# Patient Record
Sex: Male | Born: 1945 | Race: White | Hispanic: No | Marital: Married | Smoking: Former smoker
Health system: Southern US, Community
[De-identification: ages and names within clinical notes are randomized; demographics above are authoritative.]

## PROBLEM LIST (undated history)

## (undated) DIAGNOSIS — I739 Peripheral vascular disease, unspecified: Secondary | ICD-10-CM

## (undated) DIAGNOSIS — I6529 Occlusion and stenosis of unspecified carotid artery: Secondary | ICD-10-CM

## (undated) DIAGNOSIS — I350 Nonrheumatic aortic (valve) stenosis: Secondary | ICD-10-CM

## (undated) DIAGNOSIS — I639 Cerebral infarction, unspecified: Secondary | ICD-10-CM

## (undated) DIAGNOSIS — M1990 Unspecified osteoarthritis, unspecified site: Secondary | ICD-10-CM

## (undated) DIAGNOSIS — R0600 Dyspnea, unspecified: Secondary | ICD-10-CM

## (undated) DIAGNOSIS — K635 Polyp of colon: Secondary | ICD-10-CM

## (undated) DIAGNOSIS — K219 Gastro-esophageal reflux disease without esophagitis: Secondary | ICD-10-CM

## (undated) DIAGNOSIS — C801 Malignant (primary) neoplasm, unspecified: Secondary | ICD-10-CM

## (undated) DIAGNOSIS — C609 Malignant neoplasm of penis, unspecified: Secondary | ICD-10-CM

## (undated) DIAGNOSIS — G459 Transient cerebral ischemic attack, unspecified: Secondary | ICD-10-CM

## (undated) DIAGNOSIS — Z9889 Other specified postprocedural states: Secondary | ICD-10-CM

## (undated) HISTORY — PX: TRANSURETHRAL RESECTION OF PROSTATE: SHX73

## (undated) HISTORY — PX: CHOLECYSTECTOMY: SHX55

## (undated) HISTORY — PX: VARICOSE VEIN SURGERY: SHX832

## (undated) HISTORY — PX: PENIS AMPUTATION: SUR28

## (undated) HISTORY — PX: AORTIC VALVE REPLACEMENT: SHX41

## (undated) HISTORY — PX: VEIN LIGATION AND STRIPPING: SHX2653

## (undated) HISTORY — PX: COLONOSCOPY: SHX174

## (undated) HISTORY — PX: APPENDECTOMY: SHX54

## (undated) HISTORY — PX: APPLICATION OF WOUND VAC: SHX5189

## (undated) HISTORY — PX: ESOPHAGOGASTRODUODENOSCOPY: SHX1529

## (undated) HISTORY — PX: OTHER SURGICAL HISTORY: SHX169

---

## 2006-03-10 ENCOUNTER — Ambulatory Visit: Payer: Self-pay | Admitting: Internal Medicine

## 2006-04-01 ENCOUNTER — Ambulatory Visit: Payer: Self-pay | Admitting: Internal Medicine

## 2006-04-20 ENCOUNTER — Ambulatory Visit (HOSPITAL_BASED_OUTPATIENT_CLINIC_OR_DEPARTMENT_OTHER): Admission: RE | Admit: 2006-04-20 | Discharge: 2006-04-20 | Payer: Self-pay | Admitting: Urology

## 2006-05-18 DIAGNOSIS — Z9889 Other specified postprocedural states: Secondary | ICD-10-CM

## 2006-05-18 HISTORY — DX: Other specified postprocedural states: Z98.890

## 2010-05-18 ENCOUNTER — Inpatient Hospital Stay: Payer: Self-pay | Admitting: Internal Medicine

## 2011-04-14 ENCOUNTER — Encounter: Payer: Self-pay | Admitting: Internal Medicine

## 2011-08-23 ENCOUNTER — Ambulatory Visit: Payer: Self-pay | Admitting: Gastroenterology

## 2012-05-10 ENCOUNTER — Encounter: Payer: Self-pay | Admitting: Internal Medicine

## 2016-03-17 ENCOUNTER — Other Ambulatory Visit: Payer: Self-pay | Admitting: Gastroenterology

## 2016-03-17 DIAGNOSIS — R935 Abnormal findings on diagnostic imaging of other abdominal regions, including retroperitoneum: Secondary | ICD-10-CM

## 2016-03-30 ENCOUNTER — Ambulatory Visit: Payer: Self-pay

## 2016-04-02 ENCOUNTER — Ambulatory Visit: Admission: RE | Admit: 2016-04-02 | Payer: Medicare HMO | Source: Ambulatory Visit

## 2016-04-07 ENCOUNTER — Ambulatory Visit: Payer: Medicare HMO

## 2016-04-21 ENCOUNTER — Ambulatory Visit
Admission: RE | Admit: 2016-04-21 | Discharge: 2016-04-21 | Disposition: A | Discharge status: Home / Self Care | Payer: Medicare HMO | Source: Ambulatory Visit | Attending: Gastroenterology | Admitting: Gastroenterology

## 2016-04-21 DIAGNOSIS — R935 Abnormal findings on diagnostic imaging of other abdominal regions, including retroperitoneum: Secondary | ICD-10-CM | POA: Diagnosis present

## 2016-04-21 DIAGNOSIS — Z8551 Personal history of malignant neoplasm of bladder: Secondary | ICD-10-CM | POA: Insufficient documentation

## 2016-04-21 DIAGNOSIS — D7389 Other diseases of spleen: Secondary | ICD-10-CM | POA: Insufficient documentation

## 2016-04-21 DIAGNOSIS — Z8546 Personal history of malignant neoplasm of prostate: Secondary | ICD-10-CM | POA: Insufficient documentation

## 2016-04-21 MED ORDER — GADOBENATE DIMEGLUMINE 529 MG/ML IV SOLN
20.0000 mL | Freq: Once | INTRAVENOUS | Status: AC | PRN
  Administered 2016-04-21: 16 mL via INTRAVENOUS

## 2016-04-26 ENCOUNTER — Ambulatory Visit
Admission: RE | Admit: 2016-04-26 | Discharge: 2016-04-26 | Disposition: A | Discharge status: Home / Self Care | Payer: Medicare HMO | Source: Ambulatory Visit | Attending: Gastroenterology | Admitting: Gastroenterology

## 2016-04-26 DIAGNOSIS — Z906 Acquired absence of other parts of urinary tract: Secondary | ICD-10-CM | POA: Diagnosis not present

## 2016-04-26 DIAGNOSIS — R933 Abnormal findings on diagnostic imaging of other parts of digestive tract: Secondary | ICD-10-CM | POA: Diagnosis not present

## 2016-04-26 DIAGNOSIS — Z9079 Acquired absence of other genital organ(s): Secondary | ICD-10-CM | POA: Diagnosis not present

## 2016-04-26 DIAGNOSIS — R935 Abnormal findings on diagnostic imaging of other abdominal regions, including retroperitoneum: Secondary | ICD-10-CM | POA: Diagnosis not present

## 2016-04-26 DIAGNOSIS — D734 Cyst of spleen: Secondary | ICD-10-CM | POA: Insufficient documentation

## 2016-04-26 HISTORY — DX: Malignant (primary) neoplasm, unspecified: C80.1

## 2016-04-26 MED ORDER — IOPAMIDOL (ISOVUE-300) INJECTION 61%
100.0000 mL | Freq: Once | INTRAVENOUS | Status: AC | PRN
  Administered 2016-04-26: 100 mL via INTRAVENOUS

## 2016-06-11 ENCOUNTER — Ambulatory Visit: Admit: 2016-06-11 | Payer: Medicare HMO | Admitting: Gastroenterology

## 2016-06-11 SURGERY — COLONOSCOPY WITH PROPOFOL
Anesthesia: General

## 2016-08-11 DIAGNOSIS — I350 Nonrheumatic aortic (valve) stenosis: Secondary | ICD-10-CM

## 2016-08-17 ENCOUNTER — Encounter
Admission: RE | Disposition: A | Discharge status: Home / Self Care | Payer: Self-pay | Source: Ambulatory Visit | Attending: Internal Medicine

## 2016-08-17 ENCOUNTER — Ambulatory Visit
Admission: RE | Admit: 2016-08-17 | Discharge: 2016-08-17 | Disposition: A | Discharge status: Home / Self Care | Payer: Medicare HMO | Source: Ambulatory Visit | Attending: Internal Medicine | Admitting: Internal Medicine

## 2016-08-17 DIAGNOSIS — Z8249 Family history of ischemic heart disease and other diseases of the circulatory system: Secondary | ICD-10-CM | POA: Diagnosis not present

## 2016-08-17 DIAGNOSIS — I350 Nonrheumatic aortic (valve) stenosis: Secondary | ICD-10-CM | POA: Diagnosis not present

## 2016-08-17 DIAGNOSIS — I110 Hypertensive heart disease with heart failure: Secondary | ICD-10-CM | POA: Insufficient documentation

## 2016-08-17 DIAGNOSIS — I509 Heart failure, unspecified: Secondary | ICD-10-CM | POA: Insufficient documentation

## 2016-08-17 HISTORY — DX: Transient cerebral ischemic attack, unspecified: G45.9

## 2016-08-17 HISTORY — PX: CARDIAC CATHETERIZATION: SHX172

## 2016-08-17 SURGERY — RIGHT/LEFT HEART CATH AND CORONARY ANGIOGRAPHY
Anesthesia: Moderate Sedation | Laterality: Bilateral

## 2016-08-17 MED ORDER — HEPARIN (PORCINE) IN NACL 2-0.9 UNIT/ML-% IJ SOLN
INTRAMUSCULAR | Status: AC
  Filled 2016-08-17: qty 1000

## 2016-08-17 MED ORDER — ACETAMINOPHEN 325 MG PO TABS: 650.0000 mg | ORAL_TABLET | ORAL | Status: DC | PRN

## 2016-08-17 MED ORDER — FENTANYL CITRATE (PF) 100 MCG/2ML IJ SOLN
INTRAMUSCULAR | Status: DC | PRN
  Administered 2016-08-17: 25 ug via INTRAVENOUS

## 2016-08-17 MED ORDER — FENTANYL CITRATE (PF) 100 MCG/2ML IJ SOLN
INTRAMUSCULAR | Status: AC
  Filled 2016-08-17: qty 2

## 2016-08-17 MED ORDER — ONDANSETRON HCL 4 MG/2ML IJ SOLN: 4.0000 mg | Freq: Four times a day (QID) | INTRAMUSCULAR | Status: DC | PRN

## 2016-08-17 MED ORDER — MIDAZOLAM HCL 2 MG/2ML IJ SOLN
INTRAMUSCULAR | Status: DC | PRN
  Administered 2016-08-17: 1 mg via INTRAVENOUS

## 2016-08-17 MED ORDER — SODIUM CHLORIDE 0.9% FLUSH
3.0000 mL | Freq: Two times a day (BID) | INTRAVENOUS | Status: DC
Start: 2016-08-17 — End: 2016-08-17

## 2016-08-17 MED ORDER — SODIUM CHLORIDE 0.9 % WEIGHT BASED INFUSION: 79.8000 mL/h | INTRAVENOUS | Status: DC

## 2016-08-17 MED ORDER — IOPAMIDOL (ISOVUE-300) INJECTION 61%
INTRAVENOUS | Status: DC | PRN
  Administered 2016-08-17: 135 mL via INTRA_ARTERIAL

## 2016-08-17 MED ORDER — MIDAZOLAM HCL 2 MG/2ML IJ SOLN
INTRAMUSCULAR | Status: AC
  Filled 2016-08-17: qty 2

## 2016-08-17 MED ORDER — SODIUM CHLORIDE 0.9 % IV SOLN: 250.0000 mL | INTRAVENOUS | Status: DC | PRN

## 2016-08-17 MED ORDER — ASPIRIN 81 MG PO CHEW: 81.0000 mg | CHEWABLE_TABLET | ORAL | Status: DC

## 2016-08-17 MED ORDER — SODIUM CHLORIDE 0.9 % WEIGHT BASED INFUSION
239.4000 mL/h | INTRAVENOUS | Status: DC
  Administered 2016-08-17: 3 mL/kg/h via INTRAVENOUS

## 2016-08-17 MED ORDER — SODIUM CHLORIDE 0.9% FLUSH: 3.0000 mL | INTRAVENOUS | Status: DC | PRN

## 2016-08-17 MED ORDER — SODIUM CHLORIDE 0.9 % WEIGHT BASED INFUSION: 1.0000 mL/kg/h | INTRAVENOUS | Status: DC

## 2016-08-17 MED ORDER — SODIUM CHLORIDE 0.9% FLUSH: 3.0000 mL | Freq: Two times a day (BID) | INTRAVENOUS | Status: DC

## 2016-08-17 SURGICAL SUPPLY — 18 items
CATH 5FR JL4 DIAGNOSTIC (CATHETERS) ×3 IMPLANT
CATH INFINITI 5 FR MPA2 (CATHETERS) ×3 IMPLANT
CATH INFINITI 5FR AL1 (CATHETERS) ×3 IMPLANT
CATH INFINITI 5FR ANG PIGTAIL (CATHETERS) ×3 IMPLANT
CATH INFINITI JR4 5F (CATHETERS) ×3 IMPLANT
CATH LANGSTON DUAL LUM PIG 6FR (CATHETERS) ×3 IMPLANT
CATH SWANZ 7F THERMO (CATHETERS) ×3 IMPLANT
DEVICE CLOSURE MYNXGRIP 6/7F (Vascular Products) ×3 IMPLANT
KIT MANI 3VAL PERCEP (MISCELLANEOUS) ×3 IMPLANT
KIT RIGHT HEART (MISCELLANEOUS) ×3 IMPLANT
NEEDLE PERC 18GX7CM (NEEDLE) ×3 IMPLANT
PACK CARDIAC CATH (CUSTOM PROCEDURE TRAY) ×3 IMPLANT
SHEATH AVANTI 6FR X 11CM (SHEATH) ×3 IMPLANT
SHEATH PINNACLE 5F 10CM (SHEATH) IMPLANT
SHEATH PINNACLE 7F 10CM (SHEATH) ×3 IMPLANT
WIRE EMERALD 3MM-J .035X150CM (WIRE) ×3 IMPLANT
WIRE EMERALD 3MM-J .035X260CM (WIRE) ×3 IMPLANT
WIRE EMERALD ST .035X150CM (WIRE) ×3 IMPLANT

## 2016-08-17 NOTE — Discharge Instructions (Signed)
Angiogram, Care After °These instructions give you information about caring for yourself after your procedure. Your doctor may also give you more specific instructions. Call your doctor if you have any problems or questions after your procedure. °Follow these instructions at home: °· Take medicines only as told by your doctor. °· Follow your doctor's instructions about: °¨ Care of the area where the tube was inserted. °¨ Bandage (dressing) changes and removal. °· You may shower 24-48 hours after the procedure or as told by your doctor. °· Do not take baths, swim, or use a hot tub until your doctor approves. °· Every day, check the area where the tube was inserted. Watch for: °¨ Redness, swelling, or pain. °¨ Fluid, blood, or pus. °· Do not apply powder or lotion to the site. °· Do not lift anything that is heavier than 10 lb (4.5 kg) for 5 days or as told by your doctor. °· Ask your doctor when you can: °¨ Return to work or school. °¨ Do physical activities or play sports. °¨ Have sex. °· Do not drive or operate heavy machinery for 24 hours or as told by your doctor. °· Have someone with you for the first 24 hours after the procedure. °· Keep all follow-up visits as told by your doctor. This is important. °Contact a health care provider if: °· You have a fever. °· You have chills. °· You have more bleeding from the area where the tube was inserted. Hold pressure on the area. °· You have redness, swelling, or pain in the area where the tube was inserted. °· You have fluid or pus coming from the area. °Get help right away if: °· You have a lot of pain in the area where the tube was inserted. °· The area where the tube was inserted is bleeding, and the bleeding does not stop after 30 minutes of holding steady pressure on the area. °· The area near or just beyond the insertion site becomes pale, cool, tingly, or numb. °This information is not intended to replace advice given to you by your health care provider. Make  sure you discuss any questions you have with your health care provider. °Document Released: 10/22/2008 Document Revised: 01/01/2016 Document Reviewed: 12/27/2012 °Elsevier Interactive Patient Education © 2017 [employer] ° °

## 2016-08-17 NOTE — Progress Notes (Signed)
Dr has been in to talk to pt and wife about results of the Cardiac Cath. Discussed follow up plan of care. All questions answered pt and wife states they understand. Follow up appt has been made and pt and wife aware. Pt resting quietly in bed, right leg straight. No complaints at this time. resp regular and unlabored pt pink warm and dry.

## 2016-08-17 NOTE — Progress Notes (Signed)
Pt sitting up in bed. Eating lunch. Right femoral dressing dry and intact. No co .

## 2016-08-17 NOTE — Progress Notes (Signed)
Pt stable home. Dressing to right groin dry and intact. No drainage or hematoma noted. Pt stable. No co pain. resp regular and unlabored. Pt pink warm and dry. Wife driver home. Pt tol proc well. tol lunch well.

## 2016-08-17 NOTE — Progress Notes (Signed)
Discharge instructions given to pt and wife. Follow up plan of care discussed. Minx site care discussed. All questions answered. States understands.

## 2016-08-23 ENCOUNTER — Encounter: Admission: RE | Payer: Self-pay | Source: Ambulatory Visit

## 2016-08-23 ENCOUNTER — Ambulatory Visit: Admission: RE | Admit: 2016-08-23 | Payer: Medicare HMO | Source: Ambulatory Visit | Admitting: Gastroenterology

## 2016-08-23 SURGERY — COLONOSCOPY WITH PROPOFOL
Anesthesia: General

## 2016-09-15 ENCOUNTER — Other Ambulatory Visit: Payer: Self-pay | Admitting: Emergency Medicine

## 2016-09-16 ENCOUNTER — Encounter: Payer: Medicare HMO | Attending: Internal Medicine | Admitting: *Deleted

## 2016-09-16 VITALS — Ht 63.5 in | Wt 176.0 lb

## 2016-09-16 DIAGNOSIS — Z87891 Personal history of nicotine dependence: Secondary | ICD-10-CM | POA: Insufficient documentation

## 2016-09-16 DIAGNOSIS — Z79899 Other long term (current) drug therapy: Secondary | ICD-10-CM | POA: Insufficient documentation

## 2016-09-16 DIAGNOSIS — Z953 Presence of xenogenic heart valve: Secondary | ICD-10-CM

## 2016-09-16 DIAGNOSIS — Z7982 Long term (current) use of aspirin: Secondary | ICD-10-CM | POA: Diagnosis not present

## 2016-09-16 NOTE — Progress Notes (Signed)
Cardiac Individual Treatment Plan  Patient Details  Name: Reginald Watkins MRN: [id number] Date of Birth: 09/23/1945 Referring Provider:   Flowsheet Row Cardiac Rehab from 09/16/2016 in [facility] Cardiac and Pulmonary Rehab  Referring Provider  Nehemiah Massed      Initial Encounter Date:  Flowsheet Row Cardiac Rehab from 09/16/2016 in [facility] Cardiac and Pulmonary Rehab  Date  09/16/16  Referring Provider  Nehemiah Massed      Visit Diagnosis: S/P heart valve replacement with bioprosthetic valve  Patient's Home Medications on Admission:  Current Outpatient Prescriptions:  .  aspirin 81 MG chewable tablet, Chew 81 mg by mouth daily., Disp: , Rfl:  .  atorvastatin (LIPITOR) 40 MG tablet, Take 40 mg by mouth daily., Disp: , Rfl:  .  Glucosamine-Chondroitin 250-200 MG TABS, Take 1 tablet by mouth daily., Disp: , Rfl:  .  metoprolol tartrate (LOPRESSOR) 25 MG tablet, Take 25 mg by mouth every 12 (twelve) hours., Disp: , Rfl:  .  Multiple Vitamin (MULTIVITAMIN) capsule, Take 1 capsule by mouth daily., Disp: , Rfl:  .  pantoprazole (PROTONIX) 40 MG tablet, Take 40 mg by mouth daily., Disp: , Rfl:  .  aspirin (GOODSENSE ASPIRIN) 325 MG tablet, Take 325 mg by mouth daily., Disp: , Rfl:  .  furosemide (LASIX) 20 MG tablet, Take 20 mg by mouth 2 (two) times daily., Disp: , Rfl:  .  Misc Natural Products (OSTEO BI-FLEX JOINT SHIELD) TABS, Take 1 tablet by mouth daily., Disp: , Rfl:  .  potassium chloride SA (K-DUR,KLOR-CON) 20 MEQ tablet, Take 20 mEq by mouth every 12 (twelve) hours., Disp: , Rfl:   Past Medical History: Past Medical History:  Diagnosis Date  . 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 TIA (transient ischemic attack)     Tobacco Use: History  Smoking Status  . Former Smoker  . Types: Pipe  Smokeless Tobacco  . Former Systems developer  . Quit date: 1998    Labs: Recent Review Flowsheet Data    There is no flowsheet data to display.       Exercise Target Goals: Date:  09/16/16  Exercise Program Goal: Individual exercise prescription set with THRR, safety & activity barriers. Participant demonstrates ability to understand and report RPE using BORG scale, to self-measure pulse accurately, and to acknowledge the importance of the exercise prescription.  Exercise Prescription Goal: Starting with aerobic activity 30 plus minutes a day, 3 days per week for initial exercise prescription. Provide home exercise prescription and guidelines that participant acknowledges understanding prior to discharge.  Activity Barriers & Risk Stratification:     Activity Barriers & Cardiac Risk Stratification - 09/16/16 1535      Activity Barriers & Cardiac Risk Stratification   Activity Barriers Arthritis;Neck/Spine Problems   Cardiac Risk Stratification High      6 Minute Walk:     6 Minute Walk    Row Name 09/16/16 1346         6 Minute Walk   Distance 1030 feet     Walk Time 6 minutes     # of Rest Breaks 0     MPH 1.95     METS 2.64     RPE 7     VO2 Peak 9.24     Symptoms No     Resting HR 109 bpm     Resting BP 104/60     Max Ex. HR 129 bpm     Max Ex. BP 134/72  Initial Exercise Prescription:     Initial Exercise Prescription - 09/16/16 1300      Date of Initial Exercise RX and Referring Provider   Date 09/16/16   Referring Provider Nehemiah Massed     Treadmill   MPH 1.5   Minutes 15   METs 2.5     Recumbant Bike   Level 1   RPM 60   Minutes 15   METs 2.5     NuStep   Level 2   Minutes 15   METs 2.5     Biostep-RELP   Level 2   Minutes 15   METs 2.5     Prescription Details   Frequency (times per week) 3   Duration Progress to 45 minutes of aerobic exercise without signs/symptoms of physical distress     Resistance Training   Training Prescription Yes   Weight 2      Perform Capillary Blood Glucose checks as needed.  Exercise Prescription Changes:   Exercise Comments:   Discharge Exercise Prescription  (Final Exercise Prescription Changes):   Nutrition:  Target Goals: Understanding of nutrition guidelines, daily intake of sodium 1500mg , cholesterol 200mg , calories 30% from fat and 7% or less from saturated fats, daily to have 5 or more servings of fruits and vegetables.  Biometrics:     Pre Biometrics - 09/16/16 1344      Pre Biometrics   Height 5' 3.5" (1.613 m)   Weight 176 lb (79.8 kg)   Waist Circumference 39 inches   Hip Circumference 42 inches   Waist to Hip Ratio 0.93 %   BMI (Calculated) 30.8   Single Leg Stand 17.55 seconds       Nutrition Therapy Plan and Nutrition Goals:     Nutrition Therapy & Goals - 09/16/16 1525      Intervention Plan   Intervention Prescribe, educate and counsel regarding individualized specific dietary modifications aiming towards targeted core components such as weight, hypertension, lipid management, diabetes, heart failure and other comorbidities.;Nutrition handout(s) given to patient.   Expected Outcomes Short Term Goal: Understand basic principles of dietary content, such as calories, fat, sodium, cholesterol and nutrients.;Short Term Goal: A plan has been developed with personal nutrition goals set during dietitian appointment.;Long Term Goal: Adherence to prescribed nutrition plan.      Nutrition Discharge: Rate Your Plate Scores:     Nutrition Assessments - 09/16/16 1526      MEDFICTS Scores   Pre Score 36      Nutrition Goals Re-Evaluation:   Psychosocial: Target Goals: Acknowledge presence or absence of depression, maximize coping skills, provide positive support system. Participant is able to verbalize types and ability to use techniques and skills needed for reducing stress and depression.  Initial Review & Psychosocial Screening:     Initial Psych Review & Screening - 09/16/16 1529      Initial Review   Current issues with Current Sleep Concerns  patient states he wakes up throughtout the night because he  cannot get comfortable.      Family Dynamics   Good Support System? Yes     Barriers   Psychosocial barriers to participate in program The patient should benefit from training in stress management and relaxation.     Screening Interventions   Interventions Encouraged to exercise;Program counselor consult      Quality of Life Scores:     Quality of Life - 09/16/16 1532      Quality of Life Scores   Health/Function Pre 27.43 %  Socioeconomic Pre 30 %   Psych/Spiritual Pre 30 %   Family Pre 30 %   GLOBAL Pre 28.91 %      PHQ-9: Recent Review Flowsheet Data    Depression screen [facility] 2/9 09/16/2016   Decreased Interest 2   Down, Depressed, Hopeless 0   PHQ - 2 Score 2   Altered sleeping 1   Tired, decreased energy 3   Change in appetite 1   Feeling bad or failure about yourself  0   Trouble concentrating 0   Moving slowly or fidgety/restless 0   Suicidal thoughts 0   PHQ-9 Score 7   Difficult doing work/chores Not difficult at all      Psychosocial Evaluation and Intervention:     Psychosocial Evaluation - 09/16/16 1531      Psychosocial Evaluation & Interventions   Interventions Therapist referral;Stress management education;Relaxation education;Encouraged to exercise with the program and follow exercise prescription     Discharge Psychosocial Assessment & Intervention   Discharge Continue support measures as needed      Psychosocial Re-Evaluation:   Vocational Rehabilitation: Provide vocational rehab assistance to qualifying candidates.   Vocational Rehab Evaluation & Intervention:     Vocational Rehab - 09/16/16 1537      Initial Vocational Rehab Evaluation & Intervention   Assessment shows need for Vocational Rehabilitation No      Education: Education Goals: Education classes will be provided on a weekly basis, covering required topics. Participant will state understanding/return demonstration of topics presented.  Learning  Barriers/Preferences:     Learning Barriers/Preferences - 09/16/16 1536      Learning Barriers/Preferences   Learning Barriers None   Learning Preferences None      Education Topics: General Nutrition Guidelines/Fats and Fiber: -Group instruction provided by verbal, written material, models and posters to present the general guidelines for heart healthy nutrition. Gives an explanation and review of dietary fats and fiber.   Controlling Sodium/Reading Food Labels: -Group verbal and written material supporting the discussion of sodium use in heart healthy nutrition. Review and explanation with models, verbal and written materials for utilization of the food label.   Exercise Physiology & Risk Factors: - Group verbal and written instruction with models to review the exercise physiology of the cardiovascular system and associated critical values. Details cardiovascular disease risk factors and the goals associated with each risk factor.   Aerobic Exercise & Resistance Training: - Gives group verbal and written discussion on the health impact of inactivity. On the components of aerobic and resistive training programs and the benefits of this training and how to safely progress through these programs.   Flexibility, Balance, General Exercise Guidelines: - Provides group verbal and written instruction on the benefits of flexibility and balance training programs. Provides general exercise guidelines with specific guidelines to those with heart or lung disease. Demonstration and skill practice provided.   Stress Management: - Provides group verbal and written instruction about the health risks of elevated stress, cause of high stress, and healthy ways to reduce stress.   Depression: - Provides group verbal and written instruction on the correlation between heart/lung disease and depressed mood, treatment options, and the stigmas associated with seeking treatment.   Anatomy & Physiology  of the Heart: - Group verbal and written instruction and models provide basic cardiac anatomy and physiology, with the coronary electrical and arterial systems. Review of: AMI, Angina, Valve disease, Heart Failure, Cardiac Arrhythmia, Pacemakers, and the ICD.   Cardiac Procedures: - Group verbal and  written instruction and models to describe the testing methods done to diagnose heart disease. Reviews the outcomes of the test results. Describes the treatment choices: Medical Management, Angioplasty, or Coronary Bypass Surgery.   Cardiac Medications: - Group verbal and written instruction to review commonly prescribed medications for heart disease. Reviews the medication, class of the drug, and side effects. Includes the steps to properly store meds and maintain the prescription regimen.   Go Sex-Intimacy & Heart Disease, Get SMART - Goal Setting: - Group verbal and written instruction through game format to discuss heart disease and the return to sexual intimacy. Provides group verbal and written material to discuss and apply goal setting through the application of the S.M.A.R.T. Method.   Other Matters of the Heart: - Provides group verbal, written materials and models to describe Heart Failure, Angina, Valve Disease, and Diabetes in the realm of heart disease. Includes description of the disease process and treatment options available to the cardiac patient.   Exercise & Equipment Safety: - Individual verbal instruction and demonstration of equipment use and safety with use of the equipment. Flowsheet Row Cardiac Rehab from 09/16/2016 in [facility] Cardiac and Pulmonary Rehab  Date  09/16/16  Educator  PS  Instruction Review Code  2- meets goals/outcomes      Infection Prevention: - Provides verbal and written material to individual with discussion of infection control including proper hand washing and proper equipment cleaning during exercise session. Flowsheet Row Cardiac Rehab from  09/16/2016 in [facility] Cardiac and Pulmonary Rehab  Date  09/16/16  Educator  PS  Instruction Review Code  2- meets goals/outcomes      Falls Prevention: - Provides verbal and written material to individual with discussion of falls prevention and safety. Flowsheet Row Cardiac Rehab from 09/16/2016 in [facility] Cardiac and Pulmonary Rehab  Date  09/16/16  Educator  PS  Instruction Review Code  2- meets goals/outcomes      Diabetes: - Individual verbal and written instruction to review signs/symptoms of diabetes, desired ranges of glucose level fasting, after meals and with exercise. Advice that pre and post exercise glucose checks will be done for 3 sessions at entry of program.    Knowledge Questionnaire Score:     Knowledge Questionnaire Score - 09/16/16 1536      Knowledge Questionnaire Score   Pre Score 17/28      Core Components/Risk Factors/Patient Goals at Admission:     Personal Goals and Risk Factors at Admission - 09/16/16 1518      Core Components/Risk Factors/Patient Goals on Admission    Weight Management Obesity;Weight Loss   Sedentary Yes   Intervention Provide advice, education, support and counseling about physical activity/exercise needs.;Develop an individualized exercise prescription for aerobic and resistive training based on initial evaluation findings, risk stratification, comorbidities and participant's personal goals.   Expected Outcomes Achievement of increased cardiorespiratory fitness and enhanced flexibility, muscular endurance and strength shown through measurements of functional capacity and personal statement of participant.   Increase Strength and Stamina Yes   Intervention Provide advice, education, support and counseling about physical activity/exercise needs.;Develop an individualized exercise prescription for aerobic and resistive training based on initial evaluation findings, risk stratification, comorbidities and participant's personal goals.    Expected Outcomes Achievement of increased cardiorespiratory fitness and enhanced flexibility, muscular endurance and strength shown through measurements of functional capacity and personal statement of participant.   Heart Failure Yes   Intervention Provide a combined exercise and nutrition program that is supplemented with education, support and  counseling about heart failure. Directed toward relieving symptoms such as shortness of breath, decreased exercise tolerance, and extremity edema.   Expected Outcomes Improve functional capacity of life;Short term: Attendance in program 2-3 days a week with increased exercise capacity. Reported lower sodium intake. Reported increased fruit and vegetable intake. Reports medication compliance.;Short term: Daily weights obtained and reported for increase. Utilizing diuretic protocols set by physician.;Long term: Adoption of self-care skills and reduction of barriers for early signs and symptoms recognition and intervention leading to self-care maintenance.   Lipids Yes   Intervention Provide education and support for participant on nutrition & aerobic/resistive exercise along with prescribed medications to achieve LDL 70mg , HDL >40mg .   Expected Outcomes Short Term: Participant states understanding of desired cholesterol values and is compliant with medications prescribed. Participant is following exercise prescription and nutrition guidelines.;Long Term: Cholesterol controlled with medications as prescribed, with individualized exercise RX and with personalized nutrition plan. Value goals: LDL < 70mg , HDL > 40 mg.   Stress Yes   Intervention Offer individual and/or small group education and counseling on adjustment to heart disease, stress management and health-related lifestyle change. Teach and support self-help strategies.   Expected Outcomes Short Term: Participant demonstrates changes in health-related behavior, relaxation and other stress management skills,  ability to obtain effective social support, and compliance with psychotropic medications if prescribed.;Long Term: Emotional wellbeing is indicated by absence of clinically significant psychosocial distress or social isolation.      Core Components/Risk Factors/Patient Goals Review:    Core Components/Risk Factors/Patient Goals at Discharge (Final Review):    ITP Comments:     ITP Comments    Row Name 09/16/16 1506           ITP Comments Medical review completed, Initial ITP completed.  Diagnosis documentation is in [place] in Discharge Note from [facility] on 09/05/2016          Comments: Ready to start cardiac rehab.

## 2016-09-16 NOTE — Progress Notes (Signed)
Daily Session Note  Patient Details  Name: Reginald Watkins MRN: [id number] Date of Birth: 1946-05-08 Referring Provider:   Flowsheet Row Cardiac Rehab from 09/16/2016 in [facility] Cardiac and Pulmonary Rehab  Referring Provider  Nehemiah Massed      Encounter Date: 09/16/2016  Check In:     Session Check In - 09/16/16 1503      Check-In   Location [facility]-Cardiac & Pulmonary Rehab   Staff Present Nada Maclachlan, BA, ACSM CEP, Exercise Physiologist;Tyreshia Ingman RN BSN   Supervising physician immediately available to respond to emergencies See telemetry face sheet for immediately available ER MD   Medication changes reported     Yes   Comments Dr. Nehemiah Massed took him off of his scheduled  furosemide and postassium chloride at todays visit.     Fall or balance concerns reported    No   Warm-up and Cool-down Not performed (comment)  walk test   Resistance Training Performed Yes   VAD Patient? No     Pain Assessment   Currently in Pain? No/denies   Multiple Pain Sites No         Goals Met:  Personal goals reviewed No report of cardiac concerns or symptoms  Goals Unmet:  Not Applicable  Comments   Dr. Emily Filbert is Medical Director for [facility].

## 2016-09-17 NOTE — Patient Instructions (Signed)
Patient Instructions  Patient Details  Name: FALON HARBOTTLE MRN: [id number] Date of Birth: 07-Jun-1946 Referring Provider:  Corey Skains, MD  Below are the personal goals you chose as well as exercise and nutrition goals. Our goal is to help you keep on track towards obtaining and maintaining your goals. We will be discussing your progress on these goals with you throughout the program.  Initial Exercise Prescription:     Initial Exercise Prescription - 09/16/16 1300      Date of Initial Exercise RX and Referring Provider   Date 09/16/16   Referring Provider Nehemiah Massed     Treadmill   MPH 1.5   Minutes 15   METs 2.5     Recumbant Bike   Level 1   RPM 60   Minutes 15   METs 2.5     NuStep   Level 2   Minutes 15   METs 2.5     Biostep-RELP   Level 2   Minutes 15   METs 2.5     Prescription Details   Frequency (times per week) 3   Duration Progress to 45 minutes of aerobic exercise without signs/symptoms of physical distress     Resistance Training   Training Prescription Yes   Weight 2      Exercise Goals: Frequency: Be able to perform aerobic exercise three times per week working toward 3-5 days per week.  Intensity: Work with a perceived exertion of 11 (fairly light) - 15 (hard) as tolerated. Follow your new exercise prescription and watch for changes in prescription as you progress with the program. Changes will be reviewed with you when they are made.  Duration: You should be able to do 30 minutes of continuous aerobic exercise in addition to a 5 minute warm-up and a 5 minute cool-down routine.  Nutrition Goals: Your personal nutrition goals will be established when you do your nutrition analysis with the dietician.  The following are nutrition guidelines to follow: Cholesterol < 200mg /day Sodium < 1500mg /day Fiber: Men over 50 yrs - 30 grams per day  Personal Goals:     Personal Goals and Risk Factors at Admission - 09/16/16 1518      Core  Components/Risk Factors/Patient Goals on Admission    Weight Management Obesity;Weight Loss   Sedentary Yes   Intervention Provide advice, education, support and counseling about physical activity/exercise needs.;Develop an individualized exercise prescription for aerobic and resistive training based on initial evaluation findings, risk stratification, comorbidities and participant's personal goals.   Expected Outcomes Achievement of increased cardiorespiratory fitness and enhanced flexibility, muscular endurance and strength shown through measurements of functional capacity and personal statement of participant.   Increase Strength and Stamina Yes   Intervention Provide advice, education, support and counseling about physical activity/exercise needs.;Develop an individualized exercise prescription for aerobic and resistive training based on initial evaluation findings, risk stratification, comorbidities and participant's personal goals.   Expected Outcomes Achievement of increased cardiorespiratory fitness and enhanced flexibility, muscular endurance and strength shown through measurements of functional capacity and personal statement of participant.   Heart Failure Yes   Intervention Provide a combined exercise and nutrition program that is supplemented with education, support and counseling about heart failure. Directed toward relieving symptoms such as shortness of breath, decreased exercise tolerance, and extremity edema.   Expected Outcomes Improve functional capacity of life;Short term: Attendance in program 2-3 days a week with increased exercise capacity. Reported lower sodium intake. Reported increased fruit and vegetable intake. Reports medication compliance.;Short  term: Daily weights obtained and reported for increase. Utilizing diuretic protocols set by physician.;Long term: Adoption of self-care skills and reduction of barriers for early signs and symptoms recognition and intervention leading  to self-care maintenance.   Lipids Yes   Intervention Provide education and support for participant on nutrition & aerobic/resistive exercise along with prescribed medications to achieve LDL 70mg , HDL >40mg .   Expected Outcomes Short Term: Participant states understanding of desired cholesterol values and is compliant with medications prescribed. Participant is following exercise prescription and nutrition guidelines.;Long Term: Cholesterol controlled with medications as prescribed, with individualized exercise RX and with personalized nutrition plan. Value goals: LDL < 70mg , HDL > 40 mg.   Stress Yes   Intervention Offer individual and/or small group education and counseling on adjustment to heart disease, stress management and health-related lifestyle change. Teach and support self-help strategies.   Expected Outcomes Short Term: Participant demonstrates changes in health-related behavior, relaxation and other stress management skills, ability to obtain effective social support, and compliance with psychotropic medications if prescribed.;Long Term: Emotional wellbeing is indicated by absence of clinically significant psychosocial distress or social isolation.      Tobacco Use Initial Evaluation: History  Smoking Status  . Former Smoker  . Types: Pipe  Smokeless Tobacco  . Former Systems developer  . Quit date: 1998    Copy of goals given to participant.

## 2016-10-06 ENCOUNTER — Encounter: Payer: Self-pay | Admitting: *Deleted

## 2016-10-06 DIAGNOSIS — Z953 Presence of xenogenic heart valve: Secondary | ICD-10-CM

## 2016-10-06 NOTE — Progress Notes (Signed)
Cardiac Individual Treatment Plan  Patient Details  Name: Reginald Watkins MRN: [id number] Date of Birth: 1945-10-09 Referring Provider:   Flowsheet Row Cardiac Rehab from 09/16/2016 in [facility] Cardiac and Pulmonary Rehab  Referring Provider  Nehemiah Massed      Initial Encounter Date:  Flowsheet Row Cardiac Rehab from 09/16/2016 in [facility] Cardiac and Pulmonary Rehab  Date  09/16/16  Referring Provider  Nehemiah Massed      Visit Diagnosis: S/P heart valve replacement with bioprosthetic valve  Patient's Home Medications on Admission:  Current Outpatient Prescriptions:  .  aspirin (GOODSENSE ASPIRIN) 325 MG tablet, Take 325 mg by mouth daily., Disp: , Rfl:  .  aspirin 81 MG chewable tablet, Chew 81 mg by mouth daily., Disp: , Rfl:  .  atorvastatin (LIPITOR) 40 MG tablet, Take 40 mg by mouth daily., Disp: , Rfl:  .  furosemide (LASIX) 20 MG tablet, Take 20 mg by mouth 2 (two) times daily., Disp: , Rfl:  .  Glucosamine-Chondroitin 250-200 MG TABS, Take 1 tablet by mouth daily., Disp: , Rfl:  .  metoprolol tartrate (LOPRESSOR) 25 MG tablet, Take 25 mg by mouth every 12 (twelve) hours., Disp: , Rfl:  .  Misc Natural Products (OSTEO BI-FLEX JOINT SHIELD) TABS, Take 1 tablet by mouth daily., Disp: , Rfl:  .  Multiple Vitamin (MULTIVITAMIN) capsule, Take 1 capsule by mouth daily., Disp: , Rfl:  .  pantoprazole (PROTONIX) 40 MG tablet, Take 40 mg by mouth daily., Disp: , Rfl:  .  potassium chloride SA (K-DUR,KLOR-CON) 20 MEQ tablet, Take 20 mEq by mouth every 12 (twelve) hours., Disp: , Rfl:   Past Medical History: Past Medical History:  Diagnosis Date  . 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 TIA (transient ischemic attack)     Tobacco Use: History  Smoking Status  . Former Smoker  . Types: Pipe  Smokeless Tobacco  . Former Systems developer  . Quit date: 1998    Labs: Recent Review Flowsheet Data    There is no flowsheet data to display.       Exercise Target Goals:    Exercise  Program Goal: Individual exercise prescription set with THRR, safety & activity barriers. Participant demonstrates ability to understand and report RPE using BORG scale, to self-measure pulse accurately, and to acknowledge the importance of the exercise prescription.  Exercise Prescription Goal: Starting with aerobic activity 30 plus minutes a day, 3 days per week for initial exercise prescription. Provide home exercise prescription and guidelines that participant acknowledges understanding prior to discharge.  Activity Barriers & Risk Stratification:     Activity Barriers & Cardiac Risk Stratification - 09/16/16 1535      Activity Barriers & Cardiac Risk Stratification   Activity Barriers Arthritis;Neck/Spine Problems   Cardiac Risk Stratification High      6 Minute Walk:     6 Minute Walk    Row Name 09/16/16 1346         6 Minute Walk   Distance 1030 feet     Walk Time 6 minutes     # of Rest Breaks 0     MPH 1.95     METS 2.64     RPE 7     VO2 Peak 9.24     Symptoms No     Resting HR 109 bpm     Resting BP 104/60     Max Ex. HR 129 bpm     Max Ex. BP 134/72  Oxygen Initial Assessment:   Oxygen Re-Evaluation:   Oxygen Discharge (Final Oxygen Re-Evaluation):   Initial Exercise Prescription:     Initial Exercise Prescription - 09/16/16 1300      Date of Initial Exercise RX and Referring Provider   Date 09/16/16   Referring Provider Nehemiah Massed     Treadmill   MPH 1.5   Minutes 15   METs 2.5     Recumbant Bike   Level 1   RPM 60   Minutes 15   METs 2.5     NuStep   Level 2   Minutes 15   METs 2.5     Biostep-RELP   Level 2   Minutes 15   METs 2.5     Prescription Details   Frequency (times per week) 3   Duration Progress to 45 minutes of aerobic exercise without signs/symptoms of physical distress     Resistance Training   Training Prescription Yes   Weight 2      Perform Capillary Blood Glucose checks as  needed.  Exercise Prescription Changes:   Exercise Comments:   Exercise Goals and Review:   Exercise Goals Re-Evaluation :   Discharge Exercise Prescription (Final Exercise Prescription Changes):   Nutrition:  Target Goals: Understanding of nutrition guidelines, daily intake of sodium 1500mg , cholesterol 200mg , calories 30% from fat and 7% or less from saturated fats, daily to have 5 or more servings of fruits and vegetables.  Biometrics:     Pre Biometrics - 09/16/16 1344      Pre Biometrics   Height 5' 3.5" (1.613 m)   Weight 176 lb (79.8 kg)   Waist Circumference 39 inches   Hip Circumference 42 inches   Waist to Hip Ratio 0.93 %   BMI (Calculated) 30.8   Single Leg Stand 17.55 seconds       Nutrition Therapy Plan and Nutrition Goals:     Nutrition Therapy & Goals - 09/16/16 1525      Intervention Plan   Intervention Prescribe, educate and counsel regarding individualized specific dietary modifications aiming towards targeted core components such as weight, hypertension, lipid management, diabetes, heart failure and other comorbidities.;Nutrition handout(s) given to patient.   Expected Outcomes Short Term Goal: Understand basic principles of dietary content, such as calories, fat, sodium, cholesterol and nutrients.;Short Term Goal: A plan has been developed with personal nutrition goals set during dietitian appointment.;Long Term Goal: Adherence to prescribed nutrition plan.      Nutrition Discharge: Rate Your Plate Scores:     Nutrition Assessments - 09/16/16 1526      MEDFICTS Scores   Pre Score 36      Nutrition Goals Re-Evaluation:   Nutrition Goals Discharge (Final Nutrition Goals Re-Evaluation):   Psychosocial: Target Goals: Acknowledge presence or absence of significant depression and/or stress, maximize coping skills, provide positive support system. Participant is able to verbalize types and ability to use techniques and skills needed for  reducing stress and depression.   Initial Review & Psychosocial Screening:     Initial Psych Review & Screening - 09/16/16 1529      Initial Review   Current issues with Current Sleep Concerns  patient states he wakes up throughtout the night because he cannot get comfortable.      Family Dynamics   Good Support System? Yes     Barriers   Psychosocial barriers to participate in program The patient should benefit from training in stress management and relaxation.     Screening Interventions  Interventions Encouraged to exercise;Program counselor consult      Quality of Life Scores:      Quality of Life - 09/16/16 1532      Quality of Life Scores   Health/Function Pre 27.43 %   Socioeconomic Pre 30 %   Psych/Spiritual Pre 30 %   Family Pre 30 %   GLOBAL Pre 28.91 %      PHQ-9: Recent Review Flowsheet Data    Depression screen Self [facility] 2/9 09/16/2016   Decreased Interest 2   Down, Depressed, Hopeless 0   PHQ - 2 Score 2   Altered sleeping 1   Tired, decreased energy 3   Change in appetite 1   Feeling bad or failure about yourself  0   Trouble concentrating 0   Moving slowly or fidgety/restless 0   Suicidal thoughts 0   PHQ-9 Score 7   Difficult doing work/chores Not difficult at all     Interpretation of Total Score  Total Score Depression Severity:  1-4 = Minimal depression, 5-9 = Mild depression, 10-14 = Moderate depression, 15-19 = Moderately severe depression, 20-27 = Severe depression   Psychosocial Evaluation and Intervention:     Psychosocial Evaluation - 09/16/16 1531      Psychosocial Evaluation & Interventions   Interventions Therapist referral;Stress management education;Relaxation education;Encouraged to exercise with the program and follow exercise prescription     Discharge Psychosocial Assessment & Intervention   Discharge Continue support measures as needed      Psychosocial Re-Evaluation:   Psychosocial Discharge (Final Psychosocial  Re-Evaluation):   Vocational Rehabilitation: Provide vocational rehab assistance to qualifying candidates.   Vocational Rehab Evaluation & Intervention:     Vocational Rehab - 09/16/16 1537      Initial Vocational Rehab Evaluation & Intervention   Assessment shows need for Vocational Rehabilitation No      Education: Education Goals: Education classes will be provided on a weekly basis, covering required topics. Participant will state understanding/return demonstration of topics presented.  Learning Barriers/Preferences:     Learning Barriers/Preferences - 09/16/16 1536      Learning Barriers/Preferences   Learning Barriers None   Learning Preferences None      Education Topics: General Nutrition Guidelines/Fats and Fiber: -Group instruction provided by verbal, written material, models and posters to present the general guidelines for heart healthy nutrition. Gives an explanation and review of dietary fats and fiber.   Controlling Sodium/Reading Food Labels: -Group verbal and written material supporting the discussion of sodium use in heart healthy nutrition. Review and explanation with models, verbal and written materials for utilization of the food label.   Exercise Physiology & Risk Factors: - Group verbal and written instruction with models to review the exercise physiology of the cardiovascular system and associated critical values. Details cardiovascular disease risk factors and the goals associated with each risk factor.   Aerobic Exercise & Resistance Training: - Gives group verbal and written discussion on the health impact of inactivity. On the components of aerobic and resistive training programs and the benefits of this training and how to safely progress through these programs.   Flexibility, Balance, General Exercise Guidelines: - Provides group verbal and written instruction on the benefits of flexibility and balance training programs. Provides general  exercise guidelines with specific guidelines to those with heart or lung disease. Demonstration and skill practice provided.   Stress Management: - Provides group verbal and written instruction about the health risks of elevated stress, cause of high stress, and healthy ways  to reduce stress.   Depression: - Provides group verbal and written instruction on the correlation between heart/lung disease and depressed mood, treatment options, and the stigmas associated with seeking treatment.   Anatomy & Physiology of the Heart: - Group verbal and written instruction and models provide basic cardiac anatomy and physiology, with the coronary electrical and arterial systems. Review of: AMI, Angina, Valve disease, Heart Failure, Cardiac Arrhythmia, Pacemakers, and the ICD.   Cardiac Procedures: - Group verbal and written instruction and models to describe the testing methods done to diagnose heart disease. Reviews the outcomes of the test results. Describes the treatment choices: Medical Management, Angioplasty, or Coronary Bypass Surgery.   Cardiac Medications: - Group verbal and written instruction to review commonly prescribed medications for heart disease. Reviews the medication, class of the drug, and side effects. Includes the steps to properly store meds and maintain the prescription regimen.   Go Sex-Intimacy & Heart Disease, Get SMART - Goal Setting: - Group verbal and written instruction through game format to discuss heart disease and the return to sexual intimacy. Provides group verbal and written material to discuss and apply goal setting through the application of the S.M.A.R.T. Method.   Other Matters of the Heart: - Provides group verbal, written materials and models to describe Heart Failure, Angina, Valve Disease, and Diabetes in the realm of heart disease. Includes description of the disease process and treatment options available to the cardiac patient.   Exercise &  Equipment Safety: - Individual verbal instruction and demonstration of equipment use and safety with use of the equipment. Flowsheet Row Cardiac Rehab from 09/16/2016 in [facility] Cardiac and Pulmonary Rehab  Date  09/16/16  Educator  PS  Instruction Review Code  2- meets goals/outcomes      Infection Prevention: - Provides verbal and written material to individual with discussion of infection control including proper hand washing and proper equipment cleaning during exercise session. Flowsheet Row Cardiac Rehab from 09/16/2016 in [facility] Cardiac and Pulmonary Rehab  Date  09/16/16  Educator  PS  Instruction Review Code  2- meets goals/outcomes      Falls Prevention: - Provides verbal and written material to individual with discussion of falls prevention and safety. Flowsheet Row Cardiac Rehab from 09/16/2016 in [facility] Cardiac and Pulmonary Rehab  Date  09/16/16  Educator  PS  Instruction Review Code  2- meets goals/outcomes      Diabetes: - Individual verbal and written instruction to review signs/symptoms of diabetes, desired ranges of glucose level fasting, after meals and with exercise. Advice that pre and post exercise glucose checks will be done for 3 sessions at entry of program.    Knowledge Questionnaire Score:     Knowledge Questionnaire Score - 09/16/16 1536      Knowledge Questionnaire Score   Pre Score 17/28      Core Components/Risk Factors/Patient Goals at Admission:     Personal Goals and Risk Factors at Admission - 09/16/16 1518      Core Components/Risk Factors/Patient Goals on Admission    Weight Management Obesity;Weight Loss   Sedentary Yes   Intervention Provide advice, education, support and counseling about physical activity/exercise needs.;Develop an individualized exercise prescription for aerobic and resistive training based on initial evaluation findings, risk stratification, comorbidities and participant's personal goals.   Expected Outcomes Achievement  of increased cardiorespiratory fitness and enhanced flexibility, muscular endurance and strength shown through measurements of functional capacity and personal statement of participant.   Increase Strength and Stamina Yes  Intervention Provide advice, education, support and counseling about physical activity/exercise needs.;Develop an individualized exercise prescription for aerobic and resistive training based on initial evaluation findings, risk stratification, comorbidities and participant's personal goals.   Expected Outcomes Achievement of increased cardiorespiratory fitness and enhanced flexibility, muscular endurance and strength shown through measurements of functional capacity and personal statement of participant.   Heart Failure Yes   Intervention Provide a combined exercise and nutrition program that is supplemented with education, support and counseling about heart failure. Directed toward relieving symptoms such as shortness of breath, decreased exercise tolerance, and extremity edema.   Expected Outcomes Improve functional capacity of life;Short term: Attendance in program 2-3 days a week with increased exercise capacity. Reported lower sodium intake. Reported increased fruit and vegetable intake. Reports medication compliance.;Short term: Daily weights obtained and reported for increase. Utilizing diuretic protocols set by physician.;Long term: Adoption of self-care skills and reduction of barriers for early signs and symptoms recognition and intervention leading to self-care maintenance.   Lipids Yes   Intervention Provide education and support for participant on nutrition & aerobic/resistive exercise along with prescribed medications to achieve LDL 70mg , HDL >40mg .   Expected Outcomes Short Term: Participant states understanding of desired cholesterol values and is compliant with medications prescribed. Participant is following exercise prescription and nutrition guidelines.;Long Term:  Cholesterol controlled with medications as prescribed, with individualized exercise RX and with personalized nutrition plan. Value goals: LDL < 70mg , HDL > 40 mg.   Stress Yes   Intervention Offer individual and/or small group education and counseling on adjustment to heart disease, stress management and health-related lifestyle change. Teach and support self-help strategies.   Expected Outcomes Short Term: Participant demonstrates changes in health-related behavior, relaxation and other stress management skills, ability to obtain effective social support, and compliance with psychotropic medications if prescribed.;Long Term: Emotional wellbeing is indicated by absence of clinically significant psychosocial distress or social isolation.      Core Components/Risk Factors/Patient Goals Review:    Core Components/Risk Factors/Patient Goals at Discharge (Final Review):    ITP Comments:     ITP Comments    Row Name 09/16/16 1506 10/06/16 0554         ITP Comments Medical review completed, Initial ITP completed.  Diagnosis documentation is in [place] in Discharge Note from [facility] on 09/05/2016 30 day review. Continue with ITP unless directed changes per Medical Director review. New to program         Comments:

## 2016-10-08 ENCOUNTER — Telehealth: Payer: Self-pay | Admitting: *Deleted

## 2016-10-08 NOTE — Telephone Encounter (Signed)
Received clearance for Reginald Watkins to start earlier.  He will start on Monday at 730am.

## 2016-10-11 ENCOUNTER — Encounter: Payer: Medicare HMO | Attending: Internal Medicine | Admitting: *Deleted

## 2016-10-11 DIAGNOSIS — Z79899 Other long term (current) drug therapy: Secondary | ICD-10-CM | POA: Insufficient documentation

## 2016-10-11 DIAGNOSIS — Z7982 Long term (current) use of aspirin: Secondary | ICD-10-CM | POA: Diagnosis not present

## 2016-10-11 DIAGNOSIS — Z953 Presence of xenogenic heart valve: Secondary | ICD-10-CM | POA: Insufficient documentation

## 2016-10-11 DIAGNOSIS — Z87891 Personal history of nicotine dependence: Secondary | ICD-10-CM | POA: Diagnosis not present

## 2016-10-11 NOTE — Progress Notes (Signed)
Daily Session Note  Patient Details  Name: NEVAAN BUNTON MRN: [id number] Date of Birth: October 03, 1945 Referring Provider:   Flowsheet Row Cardiac Rehab from 09/16/2016 in [facility] Cardiac and Pulmonary Rehab  Referring Provider  Nehemiah Massed      Encounter Date: 10/11/2016  Check In:     Session Check In - 10/11/16 0848      Check-In   Location [facility]-Cardiac & Pulmonary Rehab   Staff Present Earlean Shawl, BS, ACSM CEP, Exercise Physiologist;Jessica Luan Pulling, MA, ACSM RCEP, Exercise Physiologist;Other   Supervising physician immediately available to respond to emergencies See telemetry face sheet for immediately available ER MD   Medication changes reported     No   Fall or balance concerns reported    No   Warm-up and Cool-down Performed on first and last piece of equipment   Resistance Training Performed Yes   VAD Patient? No     Pain Assessment   Currently in Pain? No/denies   Multiple Pain Sites No         History  Smoking Status  . Former Smoker  . Types: Pipe  Smokeless Tobacco  . Former Systems developer  . Quit date: 1998    Goals Met:  Exercise tolerated well Personal goals reviewed No report of cardiac concerns or symptoms Strength training completed today  Goals Unmet:  Not Applicable  Comments: First full day of exercise!  Patient was oriented to gym and equipment including functions, settings, policies, and procedures.  Patient's individual exercise prescription and treatment plan were reviewed.  All starting workloads were established based on the results of the 6 minute walk test done at initial orientation visit.  The plan for exercise progression was also introduced and progression will be customized based on patient's performance and goals.    Dr. Emily Filbert is Medical Director for [place] and [facility].

## 2016-10-13 DIAGNOSIS — Z953 Presence of xenogenic heart valve: Secondary | ICD-10-CM | POA: Diagnosis not present

## 2016-10-13 NOTE — Progress Notes (Signed)
Daily Session Note  Patient Details  Name: Reginald Watkins MRN: [id number] Date of Birth: June 15, 1946 Referring Provider:   Flowsheet Row Cardiac Rehab from 10/11/2016 in [facility] Cardiac and Pulmonary Rehab  Referring Provider  Nehemiah Massed      Encounter Date: 10/13/2016  Check In:     Session Check In - 10/13/16 0851      Check-In   Location [facility]-Cardiac & Pulmonary Rehab   Staff Present Alberteen Sam, MA, ACSM RCEP, Exercise Physiologist;Susanne Bice, RN, BSN, Lance Sell, BA, ACSM CEP, Exercise Physiologist   Supervising physician immediately available to respond to emergencies See telemetry face sheet for immediately available ER MD   Medication changes reported     No   Fall or balance concerns reported    No   Warm-up and Cool-down Performed on first and last piece of equipment   Resistance Training Performed Yes     Pain Assessment   Currently in Pain? No/denies           Exercise Prescription Changes - 10/12/16 1500      Response to Exercise   Blood Pressure (Admit) 128/72   Blood Pressure (Exercise) 138/74   Blood Pressure (Exit) 110/72   Heart Rate (Admit) 113 bpm   Heart Rate (Exercise) 134 bpm   Heart Rate (Exit) 87 bpm   Rating of Perceived Exertion (Exercise) 13   Symptoms none   Duration Progress to 45 minutes of aerobic exercise without signs/symptoms of physical distress   Intensity THRR unchanged     Progression   Progression Continue to progress workloads to maintain intensity without signs/symptoms of physical distress.     Resistance Training   Training Prescription Yes   Weight 2 lbs   Reps 10-15     Interval Training   Interval Training No     NuStep   Level 2   Minutes 15   METs 3.3     Biostep-RELP   Level 1   Minutes 15   METs 3      History  Smoking Status  . Former Smoker  . Types: Pipe  Smokeless Tobacco  . Former Systems developer  . Quit date: 1998    Goals Met:  Independence with exercise equipment Exercise tolerated  well No report of cardiac concerns or symptoms Strength training completed today  Goals Unmet:  Not Applicable  Comments: Pt able to follow exercise prescription today without complaint.  Will continue to monitor for progression.    Dr. Emily Filbert is Medical Director for [place] and [facility].

## 2016-10-15 ENCOUNTER — Encounter: Payer: Medicare HMO | Admitting: *Deleted

## 2016-10-15 DIAGNOSIS — Z953 Presence of xenogenic heart valve: Secondary | ICD-10-CM | POA: Diagnosis not present

## 2016-10-15 NOTE — Progress Notes (Signed)
Daily Session Note  Patient Details  Name: ROLLA KEDZIERSKI MRN: [id number] Date of Birth: November 28, 1945 Referring Provider:   Flowsheet Row Cardiac Rehab from 10/11/2016 in [facility] Cardiac and Pulmonary Rehab  Referring Provider  Nehemiah Massed      Encounter Date: 10/15/2016  Check In:     Session Check In - 10/15/16 0917      Check-In   Staff Present Heath Lark, RN, BSN, CCRP;Jessica Luan Pulling, MA, ACSM RCEP, Exercise Physiologist;Patricia Surles RN BSN   Supervising physician immediately available to respond to emergencies See telemetry face sheet for immediately available ER MD   Medication changes reported     No   Fall or balance concerns reported    No   Warm-up and Cool-down Performed on first and last piece of equipment   Resistance Training Performed Yes   VAD Patient? No     Pain Assessment   Currently in Pain? No/denies         History  Smoking Status  . Former Smoker  . Types: Pipe  Smokeless Tobacco  . Former Systems developer  . Quit date: 1998    Goals Met:  Exercise tolerated well No report of cardiac concerns or symptoms  Goals Unmet:  Not Applicable  Comments: Doing well with exercise prescription progression.    Dr. Emily Filbert is Medical Director for [place] and [facility].

## 2016-10-18 ENCOUNTER — Encounter: Payer: Medicare HMO | Admitting: *Deleted

## 2016-10-18 DIAGNOSIS — Z953 Presence of xenogenic heart valve: Secondary | ICD-10-CM | POA: Diagnosis not present

## 2016-10-18 NOTE — Progress Notes (Signed)
Daily Session Note  Patient Details  Name: Reginald Watkins MRN: [id number] Date of Birth: 25-Nov-1945 Referring Provider:   Flowsheet Row Cardiac Rehab from 10/11/2016 in [facility] Cardiac and Pulmonary Rehab  Referring Provider  Nehemiah Massed      Encounter Date: 10/18/2016  Check In:     Session Check In - 10/18/16 0837      Check-In   Location [facility]-Cardiac & Pulmonary Rehab   Staff Present Alberteen Sam, MA, ACSM RCEP, Exercise Physiologist;Kelly Amedeo Plenty, BS, ACSM CEP, Exercise Physiologist;Otto Caraway, RN, BSN   Supervising physician immediately available to respond to emergencies See telemetry face sheet for immediately available ER MD   Medication changes reported     No   Fall or balance concerns reported    No   Warm-up and Cool-down Performed on first and last piece of equipment   Resistance Training Performed Yes   VAD Patient? No     Pain Assessment   Currently in Pain? No/denies   Multiple Pain Sites No         History  Smoking Status  . Former Smoker  . Types: Pipe  Smokeless Tobacco  . Former Systems developer  . Quit date: 1998    Goals Met:  Independence with exercise equipment Exercise tolerated well No report of cardiac concerns or symptoms Strength training completed today  Goals Unmet:  Not Applicable  Comments: Pt able to follow exercise prescription today without complaint.  Will continue to monitor for progression.    Dr. Emily Filbert is Medical Director for [place] and [facility].

## 2016-10-20 ENCOUNTER — Encounter: Payer: Medicare HMO | Admitting: *Deleted

## 2016-10-20 DIAGNOSIS — Z953 Presence of xenogenic heart valve: Secondary | ICD-10-CM | POA: Diagnosis not present

## 2016-10-20 NOTE — Progress Notes (Signed)
Daily Session Note  Patient Details  Name: AMON COSTILLA MRN: [id number] Date of Birth: 22-Mar-1946 Referring Provider:   Flowsheet Row Cardiac Rehab from 10/11/2016 in [facility] Cardiac and Pulmonary Rehab  Referring Provider  Nehemiah Massed      Encounter Date: 10/20/2016  Check In:     Session Check In - 10/20/16 0920      Check-In   Staff Present Heath Lark, RN, BSN, Lance Sell, BA, ACSM CEP, Exercise Physiologist;Other  Darel Hong RN BSN   Supervising physician immediately available to respond to emergencies See telemetry face sheet for immediately available ER MD   Medication changes reported     No   Fall or balance concerns reported    No   Resistance Training Performed Yes   VAD Patient? No         History  Smoking Status  . Former Smoker  . Types: Pipe  Smokeless Tobacco  . Former Systems developer  . Quit date: 1998    Goals Met:  Exercise tolerated well No report of cardiac concerns or symptoms Strength training completed today  Goals Unmet:  Not Applicable  Comments: Doing well with exercise prescription progression.    Dr. Emily Filbert is Medical Director for [place] and [facility].

## 2016-10-22 ENCOUNTER — Encounter: Payer: Medicare HMO | Admitting: *Deleted

## 2016-10-22 DIAGNOSIS — Z953 Presence of xenogenic heart valve: Secondary | ICD-10-CM

## 2016-10-22 NOTE — Progress Notes (Signed)
Daily Session Note  Patient Details  Name: BLADE SCHEFF MRN: [id number] Date of Birth: 1946/04/01 Referring Provider:     Cardiac Rehab from 10/11/2016 in [facility] Cardiac and Pulmonary Rehab  Referring Provider  Nehemiah Massed      Encounter Date: 10/22/2016  Check In:     Session Check In - 10/22/16 0850      Check-In   Location [facility]-Cardiac & Pulmonary Rehab   Staff Present Gerlene Burdock, RN, Vickki Hearing, BA, ACSM CEP, Exercise Physiologist  Rock physician immediately available to respond to emergencies See telemetry face sheet for immediately available ER MD   Medication changes reported     No   Fall or balance concerns reported    No   Warm-up and Cool-down Performed on first and last piece of equipment   Resistance Training Performed Yes   VAD Patient? No     Pain Assessment   Currently in Pain? No/denies         History  Smoking Status  . Former Smoker  . Types: Pipe  Smokeless Tobacco  . Former Systems developer  . Quit date: 1998    Goals Met:  Proper associated with RPD/PD & O2 Sat Exercise tolerated well  Goals Unmet:  Not Applicable  Comments:     Dr. Emily Filbert is Medical Director for [place] and [facility].

## 2016-10-25 ENCOUNTER — Other Ambulatory Visit: Payer: Self-pay | Admitting: Internal Medicine

## 2016-10-25 ENCOUNTER — Encounter: Payer: Medicare HMO | Admitting: *Deleted

## 2016-10-25 ENCOUNTER — Ambulatory Visit
Admission: RE | Admit: 2016-10-25 | Discharge: 2016-10-25 | Disposition: A | Discharge status: Home / Self Care | Payer: Medicare HMO | Source: Ambulatory Visit | Attending: Internal Medicine | Admitting: Internal Medicine

## 2016-10-25 DIAGNOSIS — G459 Transient cerebral ischemic attack, unspecified: Secondary | ICD-10-CM

## 2016-10-25 DIAGNOSIS — I6782 Cerebral ischemia: Secondary | ICD-10-CM | POA: Diagnosis not present

## 2016-10-25 DIAGNOSIS — Z953 Presence of xenogenic heart valve: Secondary | ICD-10-CM

## 2016-10-25 NOTE — Progress Notes (Signed)
Daily Session Note  Patient Details  Name: Reginald Watkins MRN: [id number] Date of Birth: May 17, 1946 Referring Provider:     Cardiac Rehab from 10/11/2016 in [facility] Cardiac and Pulmonary Rehab  Referring Provider  Nehemiah Massed      Encounter Date: 10/25/2016  Check In:     Session Check In - 10/25/16 0758      Check-In   Location [facility]-Cardiac & Pulmonary Rehab   Staff Present Gerlene Burdock, RN, Moises Blood, BS, ACSM CEP, Exercise Physiologist;Zeeshan Korte Luan Pulling, [place], ACSM RCEP, Exercise Physiologist   Supervising physician immediately available to respond to emergencies See telemetry face sheet for immediately available ER MD   Medication changes reported     No   Fall or balance concerns reported    No   Warm-up and Cool-down Performed on first and last piece of equipment   Resistance Training Performed Yes   VAD Patient? No     Pain Assessment   Currently in Pain? No/denies   Multiple Pain Sites No         History  Smoking Status  . Former Smoker  . Types: Pipe  Smokeless Tobacco  . Former Systems developer  . Quit date: 1998    Goals Met:  Independence with exercise equipment Exercise tolerated well Personal goals reviewed No report of cardiac concerns or symptoms Strength training completed today  Goals Unmet:  Not Applicable  Comments: Pt able to follow exercise prescription today without complaint.  Will continue to monitor for progression. See ITP for goals   Dr. Emily Filbert is Medical Director for [place] and [facility].

## 2016-10-26 ENCOUNTER — Ambulatory Visit: Payer: Medicare HMO

## 2016-10-27 DIAGNOSIS — Z953 Presence of xenogenic heart valve: Secondary | ICD-10-CM

## 2016-10-27 NOTE — Progress Notes (Signed)
Daily Session Note  Patient Details  Name: Reginald Watkins MRN: [id number] Date of Birth: 03-04-1946 Referring Provider:     Cardiac Rehab from 10/11/2016 in [facility] Cardiac and Pulmonary Rehab  Referring Provider  Nehemiah Massed      Encounter Date: 10/27/2016  Check In:     Session Check In - 10/27/16 0827      Check-In   Location [facility]-Cardiac & Pulmonary Rehab   Staff Present Alberteen Sam, MA, ACSM RCEP, Exercise Physiologist;Susanne Bice, RN, BSN, Lance Sell, BA, ACSM CEP, Exercise Physiologist   Supervising physician immediately available to respond to emergencies See telemetry face sheet for immediately available ER MD   Medication changes reported     No   Fall or balance concerns reported    No   Warm-up and Cool-down Performed on first and last piece of equipment   Resistance Training Performed Yes   VAD Patient? No     Pain Assessment   Currently in Pain? No/denies           Exercise Prescription Changes - 10/26/16 1500      Response to Exercise   Blood Pressure (Admit) 146/74   Blood Pressure (Exercise) 148/64   Blood Pressure (Exit) 124/64   Heart Rate (Admit) 57 bpm   Heart Rate (Exercise) 141 bpm   Heart Rate (Exit) 106 bpm   Rating of Perceived Exertion (Exercise) 15   Symptoms none   Duration Continue with 45 min of aerobic exercise without signs/symptoms of physical distress.   Intensity THRR unchanged     Progression   Progression Continue to progress workloads to maintain intensity without signs/symptoms of physical distress.   Average METs 3.85     Resistance Training   Training Prescription Yes   Weight 4 lbs   Reps 10-15     Interval Training   Interval Training No     Treadmill   MPH 3.2   Grade 2.5   Minutes 15   METs 4.44     NuStep   Level 5   Minutes 15   METs 4     Biostep-RELP   Level 4   Minutes 15   METs 3      History  Smoking Status  . Former Smoker  . Types: Pipe  Smokeless Tobacco  . Former Systems developer   . Quit date: 1998    Goals Met:  Independence with exercise equipment Exercise tolerated well No report of cardiac concerns or symptoms Strength training completed today  Goals Unmet:  Not Applicable  Comments: Pt able to follow exercise prescription today without complaint.  Will continue to monitor for progression.    Dr. Emily Filbert is Medical Director for [place] and [facility].

## 2016-10-29 DIAGNOSIS — Z953 Presence of xenogenic heart valve: Secondary | ICD-10-CM

## 2016-10-29 NOTE — Progress Notes (Signed)
Daily Session Note  Patient Details  Name: Reginald Watkins MRN: [id number] Date of Birth: [birth year] Referring Provider:     Cardiac Rehab from 10/11/2016 in [facility] Cardiac and Pulmonary Rehab  Referring Provider  Nehemiah Massed      Encounter Date: 10/29/2016  Check In:     Session Check In - 10/29/16 0837      Check-In   Location [facility]-Cardiac & Pulmonary Rehab   Staff Present Nyoka Cowden, RN, BSN, Francene Boyers, DPT, CEEA  Darel Hong RN BSN   Supervising physician immediately available to respond to emergencies See telemetry face sheet for immediately available ER MD   Medication changes reported     No   Fall or balance concerns reported    No   Tobacco Cessation No Change   Warm-up and Cool-down Performed on first and last piece of equipment   Resistance Training Performed Yes   VAD Patient? No     Pain Assessment   Currently in Pain? No/denies   Multiple Pain Sites No         History  Smoking Status  . Former Smoker  . Types: Pipe  Smokeless Tobacco  . Former Systems developer  . Quit date: 1998    Goals Met:  Independence with exercise equipment  Goals Unmet:  Not Applicable  Comments: Patient completed exercise prescription and all exercise goals during rehab session. The exercise was tolerated well and the patient is progressing in the program.    Dr. Emily Filbert is Medical Director for [place] and [facility].

## 2016-11-01 ENCOUNTER — Encounter: Payer: Medicare HMO | Admitting: *Deleted

## 2016-11-01 DIAGNOSIS — Z953 Presence of xenogenic heart valve: Secondary | ICD-10-CM

## 2016-11-01 NOTE — Progress Notes (Signed)
Daily Session Note  Patient Details  Name: Reginald Watkins MRN: [id number] Date of Birth: 06-19-1946 Referring Provider:     Cardiac Rehab from 10/11/2016 in [facility] Cardiac and Pulmonary Rehab  Referring Provider  Nehemiah Massed      Encounter Date: 11/01/2016  Check In:     Session Check In - 11/01/16 0753      Check-In   Location [facility]-Cardiac & Pulmonary Rehab   Staff Present Alberteen Sam, MA, ACSM RCEP, Exercise Physiologist;Jacqualine Weichel Amedeo Plenty, BS, ACSM CEP, Exercise Physiologist;Carroll Enterkin, RN, BSN   Supervising physician immediately available to respond to emergencies See telemetry face sheet for immediately available ER MD   Medication changes reported     No   Fall or balance concerns reported    No   Warm-up and Cool-down Performed on first and last piece of equipment   Resistance Training Performed Yes   VAD Patient? No     Pain Assessment   Currently in Pain? No/denies   Multiple Pain Sites No         History  Smoking Status  . Former Smoker  . Types: Pipe  Smokeless Tobacco  . Former Systems developer  . Quit date: 1998    Goals Met:  Independence with exercise equipment Exercise tolerated well No report of cardiac concerns or symptoms Strength training completed today  Goals Unmet:  Not Applicable  Comments: Pt able to follow exercise prescription today without complaint.  Will continue to monitor for progression.    Dr. Emily Filbert is Medical Director for [place] and [facility].

## 2016-11-03 ENCOUNTER — Encounter: Payer: Medicare HMO | Admitting: *Deleted

## 2016-11-03 ENCOUNTER — Encounter: Payer: Self-pay | Admitting: *Deleted

## 2016-11-03 DIAGNOSIS — Z953 Presence of xenogenic heart valve: Secondary | ICD-10-CM

## 2016-11-03 NOTE — Progress Notes (Signed)
Daily Session Note  Patient Details  Name: Reginald Watkins MRN: [id number] Date of Birth: [birth year] Referring Provider:     Cardiac Rehab from 10/11/2016 in [facility] Cardiac and Pulmonary Rehab  Referring Provider  Nehemiah Massed      Encounter Date: 11/03/2016  Check In:     Session Check In - 11/03/16 0911      Check-In   Location [facility]-Cardiac & Pulmonary Rehab   Staff Present Alberteen Sam, MA, ACSM RCEP, Exercise Physiologist;Susanne Bice, RN, BSN, Lance Sell, BA, ACSM CEP, Exercise Physiologist   Supervising physician immediately available to respond to emergencies See telemetry face sheet for immediately available ER MD   Medication changes reported     No   Fall or balance concerns reported    No   Warm-up and Cool-down Performed on first and last piece of equipment   Resistance Training Performed Yes   VAD Patient? No     Pain Assessment   Currently in Pain? No/denies   Multiple Pain Sites No         History  Smoking Status  . Former Smoker  . Types: Pipe  Smokeless Tobacco  . Former Systems developer  . Quit date: 1998    Goals Met:  Independence with exercise equipment Exercise tolerated well No report of cardiac concerns or symptoms Strength training completed today  Goals Unmet:  Not Applicable  Comments: Pt able to follow exercise prescription today without complaint.  Will continue to monitor for progression.    Dr. Emily Filbert is Medical Director for [place] and [facility].

## 2016-11-03 NOTE — Progress Notes (Signed)
Cardiac Individual Treatment Plan  Patient Details  Name: Reginald Watkins MRN: [id number] Date of Birth: 11-25-1945 Referring Provider:     Cardiac Rehab from 10/11/2016 in [facility] Cardiac and Pulmonary Rehab  Referring Provider  Nehemiah Massed      Initial Encounter Date:    Cardiac Rehab from 10/11/2016 in [facility] Cardiac and Pulmonary Rehab  Date  09/16/16  Referring Provider  Nehemiah Massed      Visit Diagnosis: S/P heart valve replacement with bioprosthetic valve  Patient's Home Medications on Admission:  Current Outpatient Prescriptions:  .  aspirin (GOODSENSE ASPIRIN) 325 MG tablet, Take 325 mg by mouth daily., Disp: , Rfl:  .  aspirin 81 MG chewable tablet, Chew 81 mg by mouth daily., Disp: , Rfl:  .  atorvastatin (LIPITOR) 40 MG tablet, Take 40 mg by mouth daily., Disp: , Rfl:  .  furosemide (LASIX) 20 MG tablet, Take 20 mg by mouth 2 (two) times daily., Disp: , Rfl:  .  Glucosamine-Chondroitin 250-200 MG TABS, Take 1 tablet by mouth daily., Disp: , Rfl:  .  metoprolol tartrate (LOPRESSOR) 25 MG tablet, Take 25 mg by mouth every 12 (twelve) hours., Disp: , Rfl:  .  Misc Natural Products (OSTEO BI-FLEX JOINT SHIELD) TABS, Take 1 tablet by mouth daily., Disp: , Rfl:  .  Multiple Vitamin (MULTIVITAMIN) capsule, Take 1 capsule by mouth daily., Disp: , Rfl:  .  pantoprazole (PROTONIX) 40 MG tablet, Take 40 mg by mouth daily., Disp: , Rfl:  .  potassium chloride SA (K-DUR,KLOR-CON) 20 MEQ tablet, Take 20 mEq by mouth every 12 (twelve) hours., Disp: , Rfl:   Past Medical History: Past Medical History:  Diagnosis Date  . 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 TIA (transient ischemic attack)     Tobacco Use: History  Smoking Status  . Former Smoker  . Types: Pipe  Smokeless Tobacco  . Former Systems developer  . Quit date: 1998    Labs: Recent Review Flowsheet Data    There is no flowsheet data to display.       Exercise Target Goals:    Exercise Program Goal: Individual  exercise prescription set with THRR, safety & activity barriers. Participant demonstrates ability to understand and report RPE using BORG scale, to self-measure pulse accurately, and to acknowledge the importance of the exercise prescription.  Exercise Prescription Goal: Starting with aerobic activity 30 plus minutes a day, 3 days per week for initial exercise prescription. Provide home exercise prescription and guidelines that participant acknowledges understanding prior to discharge.  Activity Barriers & Risk Stratification:     Activity Barriers & Cardiac Risk Stratification - 09/16/16 1535      Activity Barriers & Cardiac Risk Stratification   Activity Barriers Arthritis;Neck/Spine Problems   Cardiac Risk Stratification High      6 Minute Walk:     6 Minute Walk    Row Name 09/16/16 1346         6 Minute Walk   Distance 1030 feet     Walk Time 6 minutes     # of Rest Breaks 0     MPH 1.95     METS 2.64     RPE 7     VO2 Peak 9.24     Symptoms No     Resting HR 109 bpm     Resting BP 104/60     Max Ex. HR 129 bpm     Max Ex. BP 134/72  Oxygen Initial Assessment:   Oxygen Re-Evaluation:   Oxygen Discharge (Final Oxygen Re-Evaluation):   Initial Exercise Prescription:     Initial Exercise Prescription - 10/11/16 0900      Date of Initial Exercise RX and Referring Provider   Date 09/16/16   Referring Provider Nehemiah Massed     Treadmill   MPH 1.5   Minutes 15   METs 2.5     Recumbant Bike   Level 1   RPM 60   Minutes 15   METs 2.5     NuStep   Level 2   Minutes 15   METs 2.5     Biostep-RELP   Level 2   Minutes 15   METs 2.5     Prescription Details   Frequency (times per week) 3     Intensity   THRR 40-80% of Max Heartrate 118-140     Resistance Training   Training Prescription Yes   Weight 2      Perform Capillary Blood Glucose checks as needed.  Exercise Prescription Changes:     Exercise Prescription Changes    Row  Name 10/12/16 1500 10/26/16 1500           Response to Exercise   Blood Pressure (Admit) 128/72 146/74      Blood Pressure (Exercise) 138/74 148/64      Blood Pressure (Exit) 110/72 124/64      Heart Rate (Admit) 113 bpm 57 bpm      Heart Rate (Exercise) 134 bpm 141 bpm      Heart Rate (Exit) 87 bpm 106 bpm      Rating of Perceived Exertion (Exercise) 13 15      Symptoms none none      Duration Progress to 45 minutes of aerobic exercise without signs/symptoms of physical distress Continue with 45 min of aerobic exercise without signs/symptoms of physical distress.      Intensity THRR unchanged THRR unchanged        Progression   Progression Continue to progress workloads to maintain intensity without signs/symptoms of physical distress. Continue to progress workloads to maintain intensity without signs/symptoms of physical distress.      Average METs  - 3.85        Resistance Training   Training Prescription Yes Yes      Weight 2 lbs 4 lbs      Reps 10-15 10-15        Interval Training   Interval Training No No        Treadmill   MPH  - 3.2      Grade  - 2.5      Minutes  - 15      METs  - 4.44        NuStep   Level 2 5      Minutes 15 15      METs 3.3 4        Biostep-RELP   Level 1 4      Minutes 15 15      METs 3 3         Exercise Comments:     Exercise Comments    Row Name 10/11/16 [phone] 10/22/16 0851         Exercise Comments First full day of exercise!  Patient was oriented to gym and equipment including functions, settings, policies, and procedures.  Patient's individual exercise prescription and treatment plan were reviewed.  All starting workloads were established based on the results  of the 6 minute walk test done at initial orientation visit.  The plan for exercise progression was also introduced and progression will be customized based on patient's performance and goals Reginald Watkins increased his treadmill incline today to 2.5%.          Exercise Goals  and Review:   Exercise Goals Re-Evaluation :     Exercise Goals Re-Evaluation    Row Name 10/12/16 1541 10/25/16 0914           Exercise Goal Re-Evaluation   Exercise Goals Review Increase Physical Activity Increase Physical Activity;Increase Strenth and Stamina      Comments Reginald Watkins has completed his first full day of exercise!!  He is off to a good start! Reginald Watkins is feeling stronger and better overall.  He seems to be enjoying rehab.  He is walking at home two extra days a week.      Expected Outcomes Short: Attend rehab classes regularly  Long: Become independent with doing more physical activity Short: Review home exercise guidelines.  Long: Continue to attend classes to work on strength and stamina.         Discharge Exercise Prescription (Final Exercise Prescription Changes):     Exercise Prescription Changes - 10/26/16 1500      Response to Exercise   Blood Pressure (Admit) 146/74   Blood Pressure (Exercise) 148/64   Blood Pressure (Exit) 124/64   Heart Rate (Admit) 57 bpm   Heart Rate (Exercise) 141 bpm   Heart Rate (Exit) 106 bpm   Rating of Perceived Exertion (Exercise) 15   Symptoms none   Duration Continue with 45 min of aerobic exercise without signs/symptoms of physical distress.   Intensity THRR unchanged     Progression   Progression Continue to progress workloads to maintain intensity without signs/symptoms of physical distress.   Average METs 3.85     Resistance Training   Training Prescription Yes   Weight 4 lbs   Reps 10-15     Interval Training   Interval Training No     Treadmill   MPH 3.2   Grade 2.5   Minutes 15   METs 4.44     NuStep   Level 5   Minutes 15   METs 4     Biostep-RELP   Level 4   Minutes 15   METs 3      Nutrition:  Target Goals: Understanding of nutrition guidelines, daily intake of sodium <1547m, cholesterol <2060m calories 30% from fat and 7% or less from saturated fats, daily to have 5 or more servings of fruits  and vegetables.  Biometrics:     Pre Biometrics - 09/16/16 1344      Pre Biometrics   Height 5' 3.5" (1.613 m)   Weight 176 lb (79.8 kg)   Waist Circumference 39 inches   Hip Circumference 42 inches   Waist to Hip Ratio 0.93 %   BMI (Calculated) 30.8   Single Leg Stand 17.55 seconds       Nutrition Therapy Plan and Nutrition Goals:     Nutrition Therapy & Goals - 09/16/16 1525      Intervention Plan   Intervention Prescribe, educate and counsel regarding individualized specific dietary modifications aiming towards targeted core components such as weight, hypertension, lipid management, diabetes, heart failure and other comorbidities.;Nutrition handout(s) given to patient.   Expected Outcomes Short Term Goal: Understand basic principles of dietary content, such as calories, fat, sodium, cholesterol and nutrients.;Short Term Goal: A plan has been  developed with personal nutrition goals set during dietitian appointment.;Long Term Goal: Adherence to prescribed nutrition plan.      Nutrition Discharge: Rate Your Plate Scores:     Nutrition Assessments - 09/16/16 1526      MEDFICTS Scores   Pre Score 36      Nutrition Goals Re-Evaluation:     Nutrition Goals Re-Evaluation    Row Name 10/25/16 0918             Goals   Current Weight 165 lb 11.2 oz (75.2 kg)       Comment Appt scheduled for 4/9 after class          Nutrition Goals Discharge (Final Nutrition Goals Re-Evaluation):     Nutrition Goals Re-Evaluation - 10/25/16 0918      Goals   Current Weight 165 lb 11.2 oz (75.2 kg)   Comment Appt scheduled for 4/9 after class      Psychosocial: Target Goals: Acknowledge presence or absence of significant depression and/or stress, maximize coping skills, provide positive support system. Participant is able to verbalize types and ability to use techniques and skills needed for reducing stress and depression.   Initial Review & Psychosocial Screening:      Initial Psych Review & Screening - 09/16/16 1529      Initial Review   Current issues with Current Sleep Concerns  patient states he wakes up throughtout the night because he cannot get comfortable.      Family Dynamics   Good Support System? Yes     Barriers   Psychosocial barriers to participate in program The patient should benefit from training in stress management and relaxation.     Screening Interventions   Interventions Encouraged to exercise;Program counselor consult      Quality of Life Scores:      Quality of Life - 09/16/16 1532      Quality of Life Scores   Health/Function Pre 27.43 %   Socioeconomic Pre 30 %   Psych/Spiritual Pre 30 %   Family Pre 30 %   GLOBAL Pre 28.91 %      PHQ-9: Recent Review Flowsheet Data    Depression screen [facility] 2/9 09/16/2016   Decreased Interest 2   Down, Depressed, Hopeless 0   PHQ - 2 Score 2   Altered sleeping 1   Tired, decreased energy 3   Change in appetite 1   Feeling bad or failure about yourself  0   Trouble concentrating 0   Moving slowly or fidgety/restless 0   Suicidal thoughts 0   PHQ-9 Score 7   Difficult doing work/chores Not difficult at all     Interpretation of Total Score  Total Score Depression Severity:  1-4 = Minimal depression, 5-9 = Mild depression, 10-14 = Moderate depression, 15-19 = Moderately severe depression, 20-27 = Severe depression   Psychosocial Evaluation and Intervention:     Psychosocial Evaluation - 10/27/16 0939      Psychosocial Evaluation & Interventions   Comments Counselor met with Reginald Watkins today for initial psychosocial evaluation.  He is a 71 year old who began this program several weeks ago.  He reports already noticing progress with his ability to walk up hill without as much shortness of breath and he has increased his strength and stamina as his intensity and duration has increased on the equipment.  He contines to have ongoing concerns about sleep and his Dr.  prescribed a Rx for this recently which he took over the  weekend.  He did sleep better; however, he had some negative side effects with dizziness and the Dr. discontinued this Rx this past Monday as a result and also because Reginald Watkins has had a stroke history.  Counselor discussed checking with his Dr. or pharmacist about whether he could try a natural over the counter supplement to help with sleep and provided him with information on this.  Reginald Watkins reports his appetite has been poor but is improving since he began this class.   He has goals to increase his stamina and strength, and just "have more energy," and this is already happening.  Staff will continue to follow with Reginald Watkins throughout the course of this program.     Expected Outcomes Reginald Watkins will continue to exercise consistently and improve his mood; energy and appetite as a result.  Reginald Watkins will check with his Dr. or pharmacist about a natural OTC sleep aid to help with his current sleep concerns.  Staff will continue to follow.   Continue Psychosocial Services  Follow up required by staff      Psychosocial Re-Evaluation:   Psychosocial Discharge (Final Psychosocial Re-Evaluation):   Vocational Rehabilitation: Provide vocational rehab assistance to qualifying candidates.   Vocational Rehab Evaluation & Intervention:     Vocational Rehab - 09/16/16 1537      Initial Vocational Rehab Evaluation & Intervention   Assessment shows need for Vocational Rehabilitation No      Education: Education Goals: Education classes will be provided on a weekly basis, covering required topics. Participant will state understanding/return demonstration of topics presented.  Learning Barriers/Preferences:     Learning Barriers/Preferences - 09/16/16 1536      Learning Barriers/Preferences   Learning Barriers None   Learning Preferences None      Education Topics: General Nutrition Guidelines/Fats and Fiber: -Group instruction provided by verbal, written  material, models and posters to present the general guidelines for heart healthy nutrition. Gives an explanation and review of dietary fats and fiber.   Cardiac Rehab from 11/01/2016 in [facility] Cardiac and Pulmonary Rehab  Date  10/11/16  Educator  CR  Instruction Review Code  2- meets goals/outcomes      Controlling Sodium/Reading Food Labels: -Group verbal and written material supporting the discussion of sodium use in heart healthy nutrition. Review and explanation with models, verbal and written materials for utilization of the food label.   Cardiac Rehab from 11/01/2016 in [facility] Cardiac and Pulmonary Rehab  Date  10/18/16  Educator  CR  Instruction Review Code  2- meets goals/outcomes      Exercise Physiology & Risk Factors: - Group verbal and written instruction with models to review the exercise physiology of the cardiovascular system and associated critical values. Details cardiovascular disease risk factors and the goals associated with each risk factor.   Cardiac Rehab from 11/01/2016 in [facility] Cardiac and Pulmonary Rehab  Date  10/25/16  Educator  [facility]  Instruction Review Code  2- meets goals/outcomes      Aerobic Exercise & Resistance Training: - Gives group verbal and written discussion on the health impact of inactivity. On the components of aerobic and resistive training programs and the benefits of this training and how to safely progress through these programs.   Cardiac Rehab from 11/01/2016 in [facility] Cardiac and Pulmonary Rehab  Date  10/27/16  Educator  [facility]  Instruction Review Code  2- meets goals/outcomes      Flexibility, Balance, General Exercise Guidelines: - Provides group verbal and written instruction on the benefits  of flexibility and balance training programs. Provides general exercise guidelines with specific guidelines to those with heart or lung disease. Demonstration and skill practice provided.   Cardiac Rehab from 11/01/2016 in [facility] Cardiac and Pulmonary Rehab   Date  11/01/16  Educator  [facility]  Instruction Review Code  2- meets goals/outcomes      Stress Management: - Provides group verbal and written instruction about the health risks of elevated stress, cause of high stress, and healthy ways to reduce stress.   Depression: - Provides group verbal and written instruction on the correlation between heart/lung disease and depressed mood, treatment options, and the stigmas associated with seeking treatment.   Cardiac Rehab from 11/01/2016 in [facility] Cardiac and Pulmonary Rehab  Date  10/13/16  Educator  [facility]  Instruction Review Code  2- meets goals/outcomes      Anatomy & Physiology of the Heart: - Group verbal and written instruction and models provide basic cardiac anatomy and physiology, with the coronary electrical and arterial systems. Review of: AMI, Angina, Valve disease, Heart Failure, Cardiac Arrhythmia, Pacemakers, and the ICD.   Cardiac Procedures: - Group verbal and written instruction and models to describe the testing methods done to diagnose heart disease. Reviews the outcomes of the test results. Describes the treatment choices: Medical Management, Angioplasty, or Coronary Bypass Surgery.   Cardiac Medications: - Group verbal and written instruction to review commonly prescribed medications for heart disease. Reviews the medication, class of the drug, and side effects. Includes the steps to properly store meds and maintain the prescription regimen.   Go Sex-Intimacy & Heart Disease, Get SMART - Goal Setting: - Group verbal and written instruction through game format to discuss heart disease and the return to sexual intimacy. Provides group verbal and written material to discuss and apply goal setting through the application of the S.M.A.R.T. Method.   Other Matters of the Heart: - Provides group verbal, written materials and models to describe Heart Failure, Angina, Valve Disease, and Diabetes in the realm of heart disease.  Includes description of the disease process and treatment options available to the cardiac patient.   Exercise & Equipment Safety: - Individual verbal instruction and demonstration of equipment use and safety with use of the equipment.   Cardiac Rehab from 11/01/2016 in [facility] Cardiac and Pulmonary Rehab  Date  09/16/16  Educator  PS  Instruction Review Code  2- meets goals/outcomes      Infection Prevention: - Provides verbal and written material to individual with discussion of infection control including proper hand washing and proper equipment cleaning during exercise session.   Cardiac Rehab from 11/01/2016 in [facility] Cardiac and Pulmonary Rehab  Date  09/16/16  Educator  PS  Instruction Review Code  2- meets goals/outcomes      Falls Prevention: - Provides verbal and written material to individual with discussion of falls prevention and safety.   Cardiac Rehab from 11/01/2016 in [facility] Cardiac and Pulmonary Rehab  Date  09/16/16  Educator  PS  Instruction Review Code  2- meets goals/outcomes      Diabetes: - Individual verbal and written instruction to review signs/symptoms of diabetes, desired ranges of glucose level fasting, after meals and with exercise. Advice that pre and post exercise glucose checks will be done for 3 sessions at entry of program.    Knowledge Questionnaire Score:     Knowledge Questionnaire Score - 09/16/16 1536      Knowledge Questionnaire Score   Pre Score 17/28  Core Components/Risk Factors/Patient Goals at Admission:     Personal Goals and Risk Factors at Admission - 09/16/16 1518      Core Components/Risk Factors/Patient Goals on Admission    Weight Management Obesity;Weight Loss   Sedentary Yes   Intervention Provide advice, education, support and counseling about physical activity/exercise needs.;Develop an individualized exercise prescription for aerobic and resistive training based on initial evaluation findings, risk  stratification, comorbidities and participant's personal goals.   Expected Outcomes Achievement of increased cardiorespiratory fitness and enhanced flexibility, muscular endurance and strength shown through measurements of functional capacity and personal statement of participant.   Increase Strength and Stamina Yes   Intervention Provide advice, education, support and counseling about physical activity/exercise needs.;Develop an individualized exercise prescription for aerobic and resistive training based on initial evaluation findings, risk stratification, comorbidities and participant's personal goals.   Expected Outcomes Achievement of increased cardiorespiratory fitness and enhanced flexibility, muscular endurance and strength shown through measurements of functional capacity and personal statement of participant.   Heart Failure Yes   Intervention Provide a combined exercise and nutrition program that is supplemented with education, support and counseling about heart failure. Directed toward relieving symptoms such as shortness of breath, decreased exercise tolerance, and extremity edema.   Expected Outcomes Improve functional capacity of life;Short term: Attendance in program 2-3 days a week with increased exercise capacity. Reported lower sodium intake. Reported increased fruit and vegetable intake. Reports medication compliance.;Short term: Daily weights obtained and reported for increase. Utilizing diuretic protocols set by physician.;Long term: Adoption of self-care skills and reduction of barriers for early signs and symptoms recognition and intervention leading to self-care maintenance.   Lipids Yes   Intervention Provide education and support for participant on nutrition & aerobic/resistive exercise along with prescribed medications to achieve LDL <33m, HDL >427m   Expected Outcomes Short Term: Participant states understanding of desired cholesterol values and is compliant with medications  prescribed. Participant is following exercise prescription and nutrition guidelines.;Long Term: Cholesterol controlled with medications as prescribed, with individualized exercise RX and with personalized nutrition plan. Value goals: LDL < 7045mHDL > 40 mg.   Stress Yes   Intervention Offer individual and/or small group education and counseling on adjustment to heart disease, stress management and health-related lifestyle change. Teach and support self-help strategies.   Expected Outcomes Short Term: Participant demonstrates changes in health-related behavior, relaxation and other stress management skills, ability to obtain effective social support, and compliance with psychotropic medications if prescribed.;Long Term: Emotional wellbeing is indicated by absence of clinically significant psychosocial distress or social isolation.      Core Components/Risk Factors/Patient Goals Review:      Goals and Risk Factor Review    Row Name 10/25/16 0915             Core Components/Risk Factors/Patient Goals Review   Personal Goals Review Weight Management/Obesity;Heart Failure;Hypertension;Lipids       Review NicMerrilee Watkins off to a good start with rehab.  His blood pressures have been good and he has not had any problems with taking his medications.  His weight has been fairly steady as he weighs daily and watches his sodium intake.  He has not had any heart failure symptoms.       Expected Outcomes Short: NicMerrilee Seashorell continue to monitor his heart failure symptoms daily.  Long: Continue to attend rehab to work on risk factor modification.          Core Components/Risk Factors/Patient Goals at Discharge (Final Review):  Goals and Risk Factor Review - 10/25/16 0915      Core Components/Risk Factors/Patient Goals Review   Personal Goals Review Weight Management/Obesity;Heart Failure;Hypertension;Lipids   Review Reginald Watkins is off to a good start with rehab.  His blood pressures have been good and he has not  had any problems with taking his medications.  His weight has been fairly steady as he weighs daily and watches his sodium intake.  He has not had any heart failure symptoms.   Expected Outcomes Short: Reginald Watkins will continue to monitor his heart failure symptoms daily.  Long: Continue to attend rehab to work on risk factor modification.      ITP Comments:     ITP Comments    Row Name 09/16/16 1506 10/06/16 0554 10/22/16 0851 11/03/16 0545     ITP Comments Medical review completed, Initial ITP completed.  Diagnosis documentation is in Anton Ruiz in Discharge Note from [facility] on 09/05/2016 30 day review. Continue with ITP unless directed changes per Medical Director review. New to program Reginald Watkins increased his treadmill incline today to 2.5%.  30 day review. Continue with ITP unless directed changes per Medical Director review       Comments:

## 2016-11-05 ENCOUNTER — Encounter: Payer: Medicare HMO | Admitting: *Deleted

## 2016-11-05 DIAGNOSIS — Z953 Presence of xenogenic heart valve: Secondary | ICD-10-CM | POA: Diagnosis not present

## 2016-11-05 NOTE — Progress Notes (Signed)
Daily Session Note  Patient Details  Name: Reginald Watkins MRN: [id number] Date of Birth: [birth year] Referring Provider:     Cardiac Rehab from 10/11/2016 in [facility] Cardiac and Pulmonary Rehab  Referring Provider  Nehemiah Massed      Encounter Date: 11/05/2016  Check In:     Session Check In - 11/05/16 0836      Check-In   Location [facility]-Cardiac & Pulmonary Rehab   Staff Present Gerlene Burdock, RN, BSN;Susanne Bice, RN, BSN, CCRP;Jessica Luan Pulling, MA, ACSM RCEP, Exercise Physiologist   Supervising physician immediately available to respond to emergencies See telemetry face sheet for immediately available ER MD   Medication changes reported     No   Fall or balance concerns reported    No   Warm-up and Cool-down Performed on first and last piece of equipment   Resistance Training Performed Yes     Pain Assessment   Currently in Pain? No/denies         History  Smoking Status  . Former Smoker  . Types: Pipe  Smokeless Tobacco  . Former Systems developer  . Quit date: 1998    Goals Met:  Proper associated with RPD/PD & O2 Sat Exercise tolerated well  Goals Unmet:  Not Applicable  Comments:     Dr. Emily Filbert is Medical Director for [place] and [facility].

## 2016-11-08 ENCOUNTER — Encounter: Payer: Medicare HMO | Attending: Internal Medicine

## 2016-11-08 DIAGNOSIS — Z87891 Personal history of nicotine dependence: Secondary | ICD-10-CM | POA: Insufficient documentation

## 2016-11-08 DIAGNOSIS — Z79899 Other long term (current) drug therapy: Secondary | ICD-10-CM | POA: Insufficient documentation

## 2016-11-08 DIAGNOSIS — Z953 Presence of xenogenic heart valve: Secondary | ICD-10-CM | POA: Insufficient documentation

## 2016-11-08 DIAGNOSIS — Z7982 Long term (current) use of aspirin: Secondary | ICD-10-CM | POA: Insufficient documentation

## 2016-11-10 DIAGNOSIS — Z953 Presence of xenogenic heart valve: Secondary | ICD-10-CM | POA: Diagnosis not present

## 2016-11-10 DIAGNOSIS — Z79899 Other long term (current) drug therapy: Secondary | ICD-10-CM | POA: Diagnosis not present

## 2016-11-10 DIAGNOSIS — Z7982 Long term (current) use of aspirin: Secondary | ICD-10-CM | POA: Diagnosis not present

## 2016-11-10 DIAGNOSIS — Z87891 Personal history of nicotine dependence: Secondary | ICD-10-CM | POA: Diagnosis not present

## 2016-11-10 NOTE — Progress Notes (Signed)
Daily Session Note  Patient Details  Name: Reginald Watkins MRN: [id number] Date of Birth: 03/29/1946 Referring Provider:     Cardiac Rehab from 10/11/2016 in [facility] Cardiac and Pulmonary Rehab  Referring Provider  [place]ki      Encounter Date: 11/10/2016  Check In:     Session Check In - 11/10/16 0844      Check-In   Location [facility]-Cardiac & Pulmonary Rehab   Staff Present Jessica Hawkins, MA, ACSM RCEP, Exercise Physiologist;Susanne Bice, RN, BSN, CCRP;Amanda Sommer, BA, ACSM CEP, Exercise Physiologist   Supervising physician immediately available to respond to emergencies See telemetry face sheet for immediately available ER MD   Medication changes reported     No   Fall or balance concerns reported    No   Warm-up and Cool-down Performed on first and last piece of equipment   Resistance Training Performed Yes   VAD Patient? No     Pain Assessment   Currently in Pain? No/denies         History  Smoking Status  . Former Smoker  . Types: Pipe  Smokeless Tobacco  . Former User  . Quit date: 1998    Goals Met:  Independence with exercise equipment Exercise tolerated well No report of cardiac concerns or symptoms Strength training completed today  Goals Unmet:  Not Applicable  Comments: Reviewed home exercise with pt today.  Pt plans to walk and join [employer] for exercise.  Reviewed THR, pulse, RPE, sign and symptoms, NTG use, and when to call 911 or MD.  Also discussed weather considerations and indoor options.  Pt voiced understanding.    Dr. Mark Miller is Medical Director for [facility] and [facility]. 

## 2016-11-12 ENCOUNTER — Encounter: Payer: Medicare HMO | Admitting: *Deleted

## 2016-11-12 DIAGNOSIS — Z953 Presence of xenogenic heart valve: Secondary | ICD-10-CM

## 2016-11-12 NOTE — Progress Notes (Signed)
Daily Session Note  Patient Details  Name: Reginald Watkins MRN: [id number] Date of Birth: 30-Aug-1945 Referring Provider:     Cardiac Rehab from 10/11/2016 in [facility] Cardiac and Pulmonary Rehab  Referring Provider  Nehemiah Massed      Encounter Date: 11/12/2016  Check In:     Session Check In - 11/12/16 0855      Check-In   Staff Present Gerlene Burdock, RN, BSN;Zakaree Mcclenahan, RN, BSN, CCRP;Jessica Luan Pulling, MA, ACSM RCEP, Exercise Physiologist   Supervising physician immediately available to respond to emergencies See telemetry face sheet for immediately available ER MD   Fall or balance concerns reported    No   Warm-up and Cool-down Performed on first and last piece of equipment   Resistance Training Performed Yes   VAD Patient? No     Pain Assessment   Currently in Pain? No/denies   Multiple Pain Sites No         History  Smoking Status  . Former Smoker  . Types: Pipe  Smokeless Tobacco  . Former Systems developer  . Quit date: 1998    Goals Met:  Exercise tolerated well No report of cardiac concerns or symptoms Strength training completed today  Goals Unmet:  Not Applicable  Comments: Doing well with exercise prescription progression.    Dr. Emily Filbert is Medical Director for [place] and [facility].

## 2016-11-15 ENCOUNTER — Encounter: Payer: Medicare HMO | Admitting: *Deleted

## 2016-11-15 DIAGNOSIS — Z953 Presence of xenogenic heart valve: Secondary | ICD-10-CM

## 2016-11-15 NOTE — Progress Notes (Signed)
Daily Session Note  Patient Details  Name: Reginald Watkins MRN: [id number] Date of Birth: 12-03-1945 Referring Provider:     Cardiac Rehab from 10/11/2016 in [facility] Cardiac and Pulmonary Rehab  Referring Provider  Nehemiah Massed      Encounter Date: 11/15/2016  Check In:     Session Check In - 11/15/16 0753      Check-In   Location [facility]-Cardiac & Pulmonary Rehab   Staff Present Gerlene Burdock, RN, Moises Blood, BS, ACSM CEP, Exercise Physiologist;Veasna Santibanez Luan Pulling, [place], ACSM RCEP, Exercise Physiologist   Supervising physician immediately available to respond to emergencies See telemetry face sheet for immediately available ER MD   Medication changes reported     Yes   Comments Changed his beta blocker to once daily   Fall or balance concerns reported    No   Warm-up and Cool-down Performed on first and last piece of equipment   Resistance Training Performed Yes   VAD Patient? No     Pain Assessment   Currently in Pain? No/denies   Multiple Pain Sites No         History  Smoking Status  . Former Smoker  . Types: Pipe  Smokeless Tobacco  . Former Systems developer  . Quit date: 1998    Goals Met:  Independence with exercise equipment Exercise tolerated well No report of cardiac concerns or symptoms Strength training completed today  Goals Unmet:  HR  Comments: Pt able to follow exercise prescription today without complaint.  Will continue to monitor for progression. [name]'s heart rate was elevated today.  He was over his target heart rate on the treadmill and then took a while to recover after exercise.  Note and strips were sent Dr. Nehemiah Massed for review and see request a target heart rate incease.   Dr. Emily Filbert is Medical Director for [place] and [facility].

## 2016-11-17 ENCOUNTER — Encounter: Payer: Medicare HMO | Admitting: *Deleted

## 2016-11-17 DIAGNOSIS — Z953 Presence of xenogenic heart valve: Secondary | ICD-10-CM

## 2016-11-17 NOTE — Progress Notes (Signed)
Daily Session Note  Patient Details  Name: AMAAN MEYER MRN: [id number] Date of Birth: 08-27-1945 Referring Provider:     Cardiac Rehab from 10/11/2016 in [facility] Cardiac and Pulmonary Rehab  Referring Provider  Nehemiah Massed      Encounter Date: 11/17/2016  Check In:     Session Check In - 11/17/16 0850      Check-In   Location [facility]-Cardiac & Pulmonary Rehab   Staff Present Alberteen Sam, MA, ACSM RCEP, Exercise Physiologist;Susanne Bice, RN, BSN, Lance Sell, BA, ACSM CEP, Exercise Physiologist   Supervising physician immediately available to respond to emergencies See telemetry face sheet for immediately available ER MD   Medication changes reported     No   Fall or balance concerns reported    No   Warm-up and Cool-down Performed on first and last piece of equipment   Resistance Training Performed Yes   VAD Patient? No     Pain Assessment   Currently in Pain? No/denies   Multiple Pain Sites No         History  Smoking Status  . Former Smoker  . Types: Pipe  Smokeless Tobacco  . Former Systems developer  . Quit date: 1998    Goals Met:  Independence with exercise equipment Exercise tolerated well Personal goals reviewed No report of cardiac concerns or symptoms Strength training completed today  Goals Unmet:  Not Applicable  Comments: Pt able to follow exercise prescription today without complaint.  Will continue to monitor for progression. See ITP for goal review   Dr. Emily Filbert is Medical Director for [place] and [facility].

## 2016-11-19 ENCOUNTER — Encounter: Payer: Medicare HMO | Admitting: *Deleted

## 2016-11-19 DIAGNOSIS — Z953 Presence of xenogenic heart valve: Secondary | ICD-10-CM | POA: Diagnosis not present

## 2016-11-19 NOTE — Progress Notes (Signed)
Daily Session Note  Patient Details  Name: MELTON WALLS MRN: [id number] Date of Birth: 04-25-1946 Referring Provider:     Cardiac Rehab from 10/11/2016 in [facility] Cardiac and Pulmonary Rehab  Referring Provider  Nehemiah Massed      Encounter Date: 11/19/2016  Check In:     Session Check In - 11/19/16 0858      Check-In   Staff Present Gerlene Burdock, RN, BSN;Jessica Luan Pulling, MA, ACSM RCEP, Exercise Physiologist;Other  Darel Hong RN BSN   Supervising physician immediately available to respond to emergencies See telemetry face sheet for immediately available ER MD   Medication changes reported     No   Fall or balance concerns reported    No   Tobacco Cessation No Change   Warm-up and Cool-down Performed on first and last piece of equipment   Resistance Training Performed Yes   VAD Patient? No     Pain Assessment   Currently in Pain? No/denies   Multiple Pain Sites No         History  Smoking Status  . Former Smoker  . Types: Pipe  Smokeless Tobacco  . Former Systems developer  . Quit date: 1998    Goals Met:  Independence with exercise equipment Exercise tolerated well No report of cardiac concerns or symptoms Strength training completed today  Goals Unmet:  Not Applicable  Comments: Pt able to follow exercise prescription today without complaint.  Will continue to monitor for progression.    Dr. Emily Filbert is Medical Director for [place] and [facility].

## 2016-11-22 ENCOUNTER — Encounter: Payer: Medicare HMO | Admitting: *Deleted

## 2016-11-22 DIAGNOSIS — Z953 Presence of xenogenic heart valve: Secondary | ICD-10-CM

## 2016-11-22 NOTE — Progress Notes (Signed)
Daily Session Note  Patient Details  Name: Reginald Watkins MRN: [id number] Date of Birth: 07/05/1946 Referring Provider:     Cardiac Rehab from 10/11/2016 in [facility] Cardiac and Pulmonary Rehab  Referring Provider  Nehemiah Massed      Encounter Date: 11/22/2016  Check In:     Session Check In - 11/22/16 0800      Check-In   Location [facility]-Cardiac & Pulmonary Rehab   Staff Present Alberteen Sam, MA, ACSM RCEP, Exercise Physiologist;Kelly Amedeo Plenty, BS, ACSM CEP, Exercise Physiologist;Carroll Enterkin, RN, BSN   Supervising physician immediately available to respond to emergencies See telemetry face sheet for immediately available ER MD   Medication changes reported     No   Fall or balance concerns reported    No   Warm-up and Cool-down Performed on first and last piece of equipment   Resistance Training Performed Yes   VAD Patient? No     Pain Assessment   Currently in Pain? No/denies   Multiple Pain Sites No         History  Smoking Status  . Former Smoker  . Types: Pipe  Smokeless Tobacco  . Former Systems developer  . Quit date: 1998    Goals Met:  Independence with exercise equipment Exercise tolerated well No report of cardiac concerns or symptoms Strength training completed today  Goals Unmet:  Not Applicable  Comments: Pt able to follow exercise prescription today without complaint.  Will continue to monitor for progression.    Dr. Emily Filbert is Medical Director for [place] and [facility].

## 2016-11-24 VITALS — Ht 63.5 in | Wt 166.0 lb

## 2016-11-24 DIAGNOSIS — Z953 Presence of xenogenic heart valve: Secondary | ICD-10-CM | POA: Diagnosis not present

## 2016-11-24 NOTE — Progress Notes (Signed)
Daily Session Note  Patient Details  Name: Reginald Watkins MRN: [id number] Date of Birth: 10-29-1945 Referring Provider:     Cardiac Rehab from 10/11/2016 in [facility] Cardiac and Pulmonary Rehab  Referring Provider  Nehemiah Massed      Encounter Date: 11/24/2016  Check In:     Session Check In - 11/24/16 4884      Check-In   Location [facility]-Cardiac & Pulmonary Rehab   Staff Present Alberteen Sam, MA, ACSM RCEP, Exercise Physiologist;Susanne Bice, RN, BSN, Lance Sell, BA, ACSM CEP, Exercise Physiologist   Supervising physician immediately available to respond to emergencies See telemetry face sheet for immediately available ER MD   Medication changes reported     No   Fall or balance concerns reported    No   Warm-up and Cool-down Performed on first and last piece of equipment   Resistance Training Performed Yes   VAD Patient? No     Pain Assessment   Currently in Pain? No/denies         History  Smoking Status  . Former Smoker  . Types: Pipe  Smokeless Tobacco  . Former Systems developer  . Quit date: 1998    Goals Met:  Independence with exercise equipment Exercise tolerated well No report of cardiac concerns or symptoms Strength training completed today  Goals Unmet:  Not Applicable  Comments:      [place] Name 09/16/16 1346 11/24/16 1146       6 Minute Walk   Phase  - Discharge    Distance 1030 feet 1586 feet    Walk Time 6 minutes 6 minutes    # of Rest Breaks 0  -    MPH 1.95 3    METS 2.64 3.76    RPE 7 12    VO2 Peak 9.24 13.17    Symptoms No No    Resting HR 109 bpm 77 bpm    Resting BP 104/60 104/60    Max Ex. HR 129 bpm 130 bpm    Max Ex. BP 134/72 148/82         Dr. Emily Filbert is Medical Director for [place] and [facility].

## 2016-11-26 ENCOUNTER — Encounter: Payer: Medicare HMO | Admitting: *Deleted

## 2016-11-26 DIAGNOSIS — Z953 Presence of xenogenic heart valve: Secondary | ICD-10-CM

## 2016-11-26 NOTE — Progress Notes (Signed)
Daily Session Note  Patient Details  Name: Reginald Watkins MRN: [id number] Date of Birth: 12/17/1945 Referring Provider:     Cardiac Rehab from 10/11/2016 in [facility] Cardiac and Pulmonary Rehab  Referring Provider  Nehemiah Massed      Encounter Date: 11/26/2016  Check In:     Session Check In - 11/26/16 0846      Check-In   Location [facility]-Cardiac & Pulmonary Rehab   Staff Present Gerlene Burdock, RN, Vickki Hearing, BA, ACSM CEP, Exercise Physiologist;Jessica Luan Pulling, MA, ACSM RCEP, Exercise Physiologist   Supervising physician immediately available to respond to emergencies See telemetry face sheet for immediately available ER MD   Medication changes reported     No   Resistance Training Performed Yes   VAD Patient? No     Pain Assessment   Currently in Pain? No/denies           Exercise Prescription Changes - 11/25/16 1100      Response to Exercise   Blood Pressure (Admit) 104/60   Blood Pressure (Exercise) 134/72   Blood Pressure (Exit) 118/70   Heart Rate (Admit) 77 bpm   Heart Rate (Exercise) 137 bpm   Heart Rate (Exit) 93 bpm   Rating of Perceived Exertion (Exercise) 14   Symptoms none   Duration Continue with 45 min of aerobic exercise without signs/symptoms of physical distress.   Intensity THRR unchanged     Progression   Progression Continue to progress workloads to maintain intensity without signs/symptoms of physical distress.   Average METs 5.57     Resistance Training   Training Prescription Yes   Weight 4 lbs   Reps 10-15     Interval Training   Interval Training Yes   Equipment NuStep;Biostep-RELP   Comments 2 min 30 sec     Treadmill   MPH 3.5   Grade 6   Minutes 15   METs 6.57     NuStep   Level 5   Minutes 15   METs 5     Biostep-RELP   Level 4   Minutes 15   METs 5     Home Exercise Plan   Plans to continue exercise at [employer] (comment)   Frequency Add 2 additional days to program exercise sessions.   Initial Home  Exercises Provided 11/10/16      History  Smoking Status  . Former Smoker  . Types: Pipe  Smokeless Tobacco  . Former Systems developer  . Quit date: 1998    Goals Met:  Proper associated with RPD/PD & O2 Sat Exercise tolerated well  Goals Unmet:  Not Applicable  Comments:     Dr. Emily Filbert is Medical Director for [place] and [facility].

## 2016-11-29 ENCOUNTER — Encounter: Payer: Medicare HMO | Admitting: *Deleted

## 2016-11-29 DIAGNOSIS — Z953 Presence of xenogenic heart valve: Secondary | ICD-10-CM

## 2016-11-29 NOTE — Progress Notes (Signed)
Daily Session Note  Patient Details  Name: Reginald Watkins MRN: [id number] Date of Birth: 10/08/1945 Referring Provider:     Cardiac Rehab from 10/11/2016 in [facility] Cardiac and Pulmonary Rehab  Referring Provider  Kowalski      Encounter Date: 11/29/2016  Check In:     Session Check In - 11/29/16 0746      Check-In   Location [facility]-Cardiac & Pulmonary Rehab   Staff Present Jessica Hawkins, MA, ACSM RCEP, Exercise Physiologist;Kelly Hayes, BS, ACSM CEP, Exercise Physiologist;Carroll Enterkin, RN, BSN   Supervising physician immediately available to respond to emergencies See telemetry face sheet for immediately available ER MD   Medication changes reported     No   Fall or balance concerns reported    No   Warm-up and Cool-down Performed on first and last piece of equipment   Resistance Training Performed Yes   VAD Patient? No     Pain Assessment   Currently in Pain? No/denies   Multiple Pain Sites No         History  Smoking Status  . Former Smoker  . Types: Pipe  Smokeless Tobacco  . Former User  . Quit date: 1998    Goals Met:  Independence with exercise equipment Exercise tolerated well No report of cardiac concerns or symptoms Strength training completed today  Goals Unmet:  Not Applicable  Comments: Pt able to follow exercise prescription today without complaint.  Will continue to monitor for progression.    Dr. Mark Miller is Medical Director for [facility] and [facility]. 

## 2016-12-01 ENCOUNTER — Encounter: Payer: Self-pay | Admitting: *Deleted

## 2016-12-01 DIAGNOSIS — Z953 Presence of xenogenic heart valve: Secondary | ICD-10-CM | POA: Diagnosis not present

## 2016-12-01 NOTE — Progress Notes (Signed)
Cardiac Individual Treatment Plan  Patient Details  Name: Reginald Watkins MRN: [id number] Date of Birth: [birth year] Referring Provider:     Cardiac Rehab from 10/11/2016 in [facility] Cardiac and Pulmonary Rehab  Referring Provider  Nehemiah Massed      Initial Encounter Date:    Cardiac Rehab from 10/11/2016 in [facility] Cardiac and Pulmonary Rehab  Date  09/16/16  Referring Provider  Nehemiah Massed      Visit Diagnosis: S/P heart valve replacement with bioprosthetic valve  Patient's Home Medications on Admission:  Current Outpatient Prescriptions:  .  aspirin (GOODSENSE ASPIRIN) 325 MG tablet, Take 325 mg by mouth daily., Disp: , Rfl:  .  aspirin 81 MG chewable tablet, Chew 81 mg by mouth daily., Disp: , Rfl:  .  atorvastatin (LIPITOR) 40 MG tablet, Take 40 mg by mouth daily., Disp: , Rfl:  .  furosemide (LASIX) 20 MG tablet, Take 20 mg by mouth 2 (two) times daily., Disp: , Rfl:  .  Glucosamine-Chondroitin 250-200 MG TABS, Take 1 tablet by mouth daily., Disp: , Rfl:  .  metoprolol tartrate (LOPRESSOR) 25 MG tablet, Take 25 mg by mouth every 12 (twelve) hours., Disp: , Rfl:  .  Misc Natural Products (OSTEO BI-FLEX JOINT SHIELD) TABS, Take 1 tablet by mouth daily., Disp: , Rfl:  .  Multiple Vitamin (MULTIVITAMIN) capsule, Take 1 capsule by mouth daily., Disp: , Rfl:  .  pantoprazole (PROTONIX) 40 MG tablet, Take 40 mg by mouth daily., Disp: , Rfl:  .  potassium chloride SA (K-DUR,KLOR-CON) 20 MEQ tablet, Take 20 mEq by mouth every 12 (twelve) hours., Disp: , Rfl:   Past Medical History: Past Medical History:  Diagnosis Date  . 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 TIA (transient ischemic attack)     Tobacco Use: History  Smoking Status  . Former Smoker  . Types: Pipe  Smokeless Tobacco  . Former Systems developer  . Quit date: 1998    Labs: Recent Review Flowsheet Data    There is no flowsheet data to display.       Exercise Target Goals:    Exercise Program Goal: Individual  exercise prescription set with THRR, safety & activity barriers. Participant demonstrates ability to understand and report RPE using BORG scale, to self-measure pulse accurately, and to acknowledge the importance of the exercise prescription.  Exercise Prescription Goal: Starting with aerobic activity 30 plus minutes a day, 3 days per week for initial exercise prescription. Provide home exercise prescription and guidelines that participant acknowledges understanding prior to discharge.  Activity Barriers & Risk Stratification:     Activity Barriers & Cardiac Risk Stratification - 09/16/16 1535      Activity Barriers & Cardiac Risk Stratification   Activity Barriers Arthritis;Neck/Spine Problems   Cardiac Risk Stratification High      6 Minute Walk:     6 Minute Walk    Row Name 09/16/16 1346 11/24/16 1146       6 Minute Walk   Phase  - Discharge    Distance 1030 feet 1586 feet    Distance % Change  - 53.9 %  556 ft    Walk Time 6 minutes 6 minutes    # of Rest Breaks 0  -    MPH 1.95 3    METS 2.64 3.76    RPE 7 12    VO2 Peak 9.24 13.17    Symptoms No No    Resting HR 109 bpm 77  bpm    Resting BP 104/60 104/60    Max Ex. HR 129 bpm 130 bpm    Max Ex. BP 134/72 148/82       Oxygen Initial Assessment:   Oxygen Re-Evaluation:   Oxygen Discharge (Final Oxygen Re-Evaluation):   Initial Exercise Prescription:     Initial Exercise Prescription - 10/11/16 0900      Date of Initial Exercise RX and Referring Provider   Date 09/16/16   Referring Provider Nehemiah Massed     Treadmill   MPH 1.5   Minutes 15   METs 2.5     Recumbant Bike   Level 1   RPM 60   Minutes 15   METs 2.5     NuStep   Level 2   Minutes 15   METs 2.5     Biostep-RELP   Level 2   Minutes 15   METs 2.5     Prescription Details   Frequency (times per week) 3     Intensity   THRR 40-80% of Max Heartrate 118-140     Resistance Training   Training Prescription Yes   Weight 2       Perform Capillary Blood Glucose checks as needed.  Exercise Prescription Changes:     Exercise Prescription Changes    Row Name 10/12/16 1500 10/26/16 1500 11/10/16 0800 11/10/16 1500 11/25/16 1100     Response to Exercise   Blood Pressure (Admit) 128/72 146/74 146/74 108/62 104/60   Blood Pressure (Exercise) 138/74 148/64 148/64 136/80 134/72   Blood Pressure (Exit) 110/72 124/64 124/64 118/72 118/70   Heart Rate (Admit) 113 bpm 57 bpm 57 bpm 97 bpm 77 bpm   Heart Rate (Exercise) 134 bpm 141 bpm 141 bpm 140 bpm 137 bpm   Heart Rate (Exit) 87 bpm 106 bpm 106 bpm 89 bpm 93 bpm   Rating of Perceived Exertion (Exercise) '13 15 15 15 14   '$ Symptoms none none none none none   Duration Progress to 45 minutes of aerobic exercise without signs/symptoms of physical distress Continue with 45 min of aerobic exercise without signs/symptoms of physical distress. Continue with 45 min of aerobic exercise without signs/symptoms of physical distress. Continue with 45 min of aerobic exercise without signs/symptoms of physical distress. Continue with 45 min of aerobic exercise without signs/symptoms of physical distress.   Intensity THRR unchanged THRR unchanged THRR unchanged THRR unchanged THRR unchanged     Progression   Progression Continue to progress workloads to maintain intensity without signs/symptoms of physical distress. Continue to progress workloads to maintain intensity without signs/symptoms of physical distress. Continue to progress workloads to maintain intensity without signs/symptoms of physical distress. Continue to progress workloads to maintain intensity without signs/symptoms of physical distress. Continue to progress workloads to maintain intensity without signs/symptoms of physical distress.   Average METs  - 3.85 3.85 6.13 5.57     Resistance Training   Training Prescription Yes Yes Yes Yes Yes   Weight 2 lbs 4 lbs 4 lbs 4 lbs 4 lbs   Reps 10-15 10-15 10-15 10-15 10-15      Interval Training   Interval Training No No No No Yes   Equipment  -  -  -  - NuStep;Biostep-RELP   Comments  -  -  -  - 2 min 30 sec     Treadmill   MPH  - 3.2 3.2 3.5 3.5   Grade  - 2.5 2.'5 5 6   '$ Minutes  - 15  $'15 15 15   'm$ METs  - 4.44 4.44 6.09 6.57     NuStep   Level '2 5 5 5 5   '$ Minutes '15 15 15 15 15   '$ METs 3.'3 4 4 '$ 5.3 5     Biostep-RELP   Level '1 4 4 4 4   '$ Minutes '15 15 15 15 15   '$ METs '3 3 3 7 5     '$ Home Exercise Plan   Plans to continue exercise at  -  - [employer] (comment) Forensic scientist (comment) Forensic scientist (comment)   Frequency  -  - Add 2 additional days to program exercise sessions. Add 2 additional days to program exercise sessions. Add 2 additional days to program exercise sessions.   Initial Home Exercises Provided  -  - 11/10/16 11/10/16 11/10/16      Exercise Comments:     Exercise Comments    Row Name 10/11/16 6295 10/22/16 0851 11/10/16 0846 11/12/16 0845 11/15/16 0951   Exercise Comments First full day of exercise!  Patient was oriented to gym and equipment including functions, settings, policies, and procedures.  Patient's individual exercise prescription and treatment plan were reviewed.  All starting workloads were established based on the results of the 6 minute walk test done at initial orientation visit.  The plan for exercise progression was also introduced and progression will be customized based on patient's performance and goals Merrilee Seashore increased his treadmill incline today to 2.5%.  Reviewed home exercise with pt today.  Pt plans to walk and join [employer] for exercise.  Reviewed THR, pulse, RPE, sign and symptoms, NTG use, and when to call 911 or MD.  Also discussed weather considerations and indoor options.  Pt voiced understanding. Reviewed METs average and discussed progression with pt today. [name]'s heart rate was elevated today.  He was over his target heart rate on the treadmill and then took a while to recover after  exercise.  Note and strips were sent Dr. Nehemiah Massed for review and see request a target heart rate incease.   [place] Name 11/29/16 0845           Exercise Comments Reviewed METs average and discussed progression with pt today.          Exercise Goals and Review:   Exercise Goals Re-Evaluation :     Exercise Goals Re-Evaluation    Row Name 10/12/16 1541 10/25/16 0914 11/10/16 1518 11/17/16 0850 11/25/16 1149     Exercise Goal Re-Evaluation   Exercise Goals Review Increase Physical Activity Increase Physical Activity;Increase Strenth and Stamina Increase Physical Activity;Increase Strenth and Stamina Increase Physical Activity;Increase Strenth and Stamina Increase Physical Activity;Increase Strenth and Stamina   Comments Merrilee Seashore has completed his first full day of exercise!!  He is off to a good start! Merrilee Seashore is feeling stronger and better overall.  He seems to be enjoying rehab.  He is walking at home two extra days a week. Merrilee Seashore continues to do well in rehab.  He is now up to 3.5 mph and 5% grade.  We will continue to monitor his progress. Merrilee Seashore is feeling stronger and has more stamina.  He was able to play 18 holes of golf yesterday!!  He is also walking at home for exercise. Merrilee Seashore continues to do well in rehab.  He is feeling even better now after changing his beta blocker medication to once a day.  He is now walking up 6% incline on the treadmill.  He also improved his walk test  by 596f!!  He will be graduating soon, but we will continue to monitor him.   Expected Outcomes Short: Attend rehab classes regularly  Long: Become independent with doing more physical activity Short: Review home exercise guidelines.  Long: Continue to attend classes to work on strength and stamina. Short: Start exercising some on his own outside of class.  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 Long:  Continue to stay active and build strength and stamina.      Discharge Exercise Prescription (Final Exercise Prescription Changes):     Exercise Prescription Changes - 11/25/16 1100      Response to Exercise   Blood Pressure (Admit) 104/60   Blood Pressure (Exercise) 134/72   Blood Pressure (Exit) 118/70   Heart Rate (Admit) 77 bpm   Heart Rate (Exercise) 137 bpm   Heart Rate (Exit) 93 bpm   Rating of Perceived Exertion (Exercise) 14   Symptoms none   Duration Continue with 45 min of aerobic exercise without signs/symptoms of physical distress.   Intensity THRR unchanged     Progression   Progression Continue to progress workloads to maintain intensity without signs/symptoms of physical distress.   Average METs 5.57     Resistance Training   Training Prescription Yes   Weight 4 lbs   Reps 10-15     Interval Training   Interval Training Yes   Equipment NuStep;Biostep-RELP   Comments 2 min 30 sec     Treadmill   MPH 3.5   Grade 6   Minutes 15   METs 6.57     NuStep   Level 5   Minutes 15   METs 5     Biostep-RELP   Level 4   Minutes 15   METs 5     Home Exercise Plan   [facility] to [facility] [employer]([facility])   Frequency Add 2 additional days to program exercise sessions.   Initial Home Exercises Provided 11/10/16      Nutrition:  Target Goals: Understanding of nutrition guidelines, daily intake of sodium '1500mg'$ , cholesterol '200mg'$ , calories 30% from fat and 7% or less from saturated fats, daily to have 5 or more servings of fruits and vegetables.  Biometrics:     Pre Biometrics - 09/16/16 1344      Pre Biometrics   Height 5' 3.5" (1.613 m)   Weight 176 lb (79.8 kg)   Waist Circumference 39 inches   Hip Circumference 42 inches   Waist to Hip Ratio 0.93 %   BMI (Calculated) 30.8   Single Leg Stand 17.55 seconds         Post Biometrics - 11/24/16 1145       Post  Biometrics   Height 5' 3.5" (1.613 m)   Weight 166 lb  (75.3 kg)   Waist Circumference 39 inches   Hip Circumference 41 inches   Waist to Hip Ratio 0.95 %   BMI (Calculated) 29   Single Leg Stand 30 seconds      Nutrition Therapy Plan and Nutrition Goals:     Nutrition Therapy & Goals - 11/15/16 1406      Nutrition Therapy   Diet Instructed on a meal plan based on 1600 calories including heart healthy dietary guidelines.   Drug/Food Interactions Statins/Certain Fruits   Protein (specify units) 7   Fiber 30 grams  Whole Grain Foods 3 servings   Saturated Fats 11 max. grams   Fruits and Vegetables 5 servings/day   Sodium 1500 grams     Personal Nutrition Goals   Nutrition Goal Increase fruits and vegetables to at least 5 servings daily.   Personal Goal #2 Read labels for saturated fat, trans fat and sodium.   Personal Goal #3 Continue with controlling portions at meals and at evening snack.     Intervention Plan   Intervention Prescribe, educate and counsel regarding individualized specific dietary modifications aiming towards targeted core components such as weight, hypertension, lipid management, diabetes, heart failure and other comorbidities.;Nutrition handout(s) given to patient.   Expected Outcomes Short Term Goal: Understand basic principles of dietary content, such as calories, fat, sodium, cholesterol and nutrients.;Short Term Goal: A plan has been developed with personal nutrition goals set during dietitian appointment.;Long Term Goal: Adherence to prescribed nutrition plan.      Nutrition Discharge: Rate Your Plate Scores:     Nutrition Assessments - 11/26/16 1307      MEDFICTS Scores   Pre Score 36   Post Score 33   Score Difference -3      Nutrition Goals Re-Evaluation:     Nutrition Goals Re-Evaluation    Row Name 10/25/16 0918 11/17/16 0910           Goals   Current Weight 165 lb 11.2 oz (75.2 kg) 165 lb (74.8 kg)      Comment Appt scheduled for 4/9 after class Meet on Monday for appt.  Felt that  appointment was worthwhile and will continue to focus on healthy eating.      Expected Outcome  - Improved heart healthy lifestyle         Nutrition Goals Discharge (Final Nutrition Goals Re-Evaluation):     Nutrition Goals Re-Evaluation - 11/17/16 0910      Goals   Current Weight 165 lb (74.8 kg)   Comment Meet on Monday for appt.  Felt that appointment was worthwhile and will continue to focus on healthy eating.   Expected Outcome Improved heart healthy lifestyle      Psychosocial: Target Goals: Acknowledge presence or absence of significant depression and/or stress, maximize coping skills, provide positive support system. Participant is able to verbalize types and ability to use techniques and skills needed for reducing stress and depression.   Initial Review & Psychosocial Screening:     Initial Psych Review & Screening - 09/16/16 1529      Initial Review   Current issues with Current Sleep Concerns  patient states he wakes up throughtout the night because he cannot get comfortable.      Family Dynamics   Good Support System? Yes     Barriers   Psychosocial barriers to participate in program The patient should benefit from training in stress management and relaxation.     Screening Interventions   Interventions Encouraged to exercise;Program counselor consult      Quality of Life Scores:      Quality of Life - 11/26/16 1310      Quality of Life Scores   Health/Function Pre 27.43 %   Health/Function Post 28.4 %   Health/Function % Change 3.54 %   Socioeconomic Pre 30 %   Socioeconomic Post 29.64 %   Socioeconomic % Change  -1.2 %   Psych/Spiritual Pre 30 %   Psych/Spiritual Post 30 %   Psych/Spiritual % Change 0 %   Family Pre 30 %   Family Post  28.8 %   Family % Change -4 %   GLOBAL Pre 28.91 %   GLOBAL Post 29.04 %   GLOBAL % Change 0.45 %      PHQ-9: Recent Review Flowsheet Data    Depression screen [facility] 2/9 11/26/2016 09/16/2016   Decreased  Interest 0 2   Down, Depressed, Hopeless 0 0   PHQ - 2 Score 0 2   Altered sleeping 1 1   Tired, decreased energy 1 3   Change in appetite 0 1   Feeling bad or failure about yourself  0 0   Trouble concentrating 0 0   Moving slowly or fidgety/restless 0 0   Suicidal thoughts 0 0   PHQ-9 Score 2 7   Difficult doing work/chores Not difficult at all Not difficult at all     Interpretation of Total Score  Total Score Depression Severity:  1-4 = Minimal depression, 5-9 = Mild depression, 10-14 = Moderate depression, 15-19 = Moderately severe depression, 20-27 = Severe depression   Psychosocial Evaluation and Intervention:     Psychosocial Evaluation - 10/27/16 0939      Psychosocial Evaluation & Interventions   Comments Counselor met with Mr. Boening today for initial psychosocial evaluation.  He is a 71 year old who began this program several weeks ago.  He reports already noticing progress with his ability to walk up hill without as much shortness of breath and he has increased his strength and stamina as his intensity and duration has increased on the equipment.  He contines to have ongoing concerns about sleep and his Dr. prescribed a Rx for this recently which he took over the weekend.  He did sleep better; however, he had some negative side effects with dizziness and the Dr. discontinued this Rx this past Monday as a result and also because Merrilee Seashore has had a stroke history.  Counselor discussed checking with his Dr. or pharmacist about whether he could try a natural over the counter supplement to help with sleep and provided him with information on this.  Merrilee Seashore reports his appetite has been poor but is improving since he began this class.   He has goals to increase his stamina and strength, and just "have more energy," and this is already happening.  Staff will continue to follow with Merrilee Seashore throughout the course of this program.     Expected Outcomes Merrilee Seashore will continue to exercise consistently  and improve his mood; energy and appetite as a result.  Merrilee Seashore will check with his Dr. or pharmacist about a natural OTC sleep aid to help with his current sleep concerns.  Staff will continue to follow.   Continue Psychosocial Services  Follow up required by staff      Psychosocial Re-Evaluation:     Psychosocial Re-Evaluation    [place] Name 11/17/16 0911             Psychosocial Re-Evaluation   Current issues with None Identified       Comments Merrilee Seashore is doing well overall.  He is feeling better and stronger.  His biggest stressors currently is his golf game!  He did try the OTC sleep aid, but it actually made him dizzy so he stopped.  After adjusting his late night snacking habits, he is now sleeping better.  He is feeling more positive overall.       Expected Outcomes ShortL Merrilee Seashore will continue to attend rehab to work on strength and stamina.  Long: Continue to sleep better.  Interventions Encouraged to attend Cardiac Rehabilitation for the exercise          Psychosocial Discharge (Final Psychosocial Re-Evaluation):     Psychosocial Re-Evaluation - 11/17/16 0911      Psychosocial Re-Evaluation   Current issues with None Identified   Comments Merrilee Seashore is doing well overall.  He is feeling better and stronger.  His biggest stressors currently is his golf game!  He did try the OTC sleep aid, but it actually made him dizzy so he stopped.  After adjusting his late night snacking habits, he is now sleeping better.  He is feeling more positive overall.   Expected Outcomes ShortL Merrilee Seashore will continue to attend rehab to work on strength and stamina.  Long: Continue to sleep better.   Interventions Encouraged to attend Cardiac Rehabilitation for the exercise      Vocational Rehabilitation: Provide vocational rehab assistance to qualifying candidates.   Vocational Rehab Evaluation & Intervention:     Vocational Rehab - 09/16/16 1537      Initial Vocational Rehab Evaluation & Intervention    Assessment shows need for Vocational Rehabilitation No      Education: Education Goals: Education classes will be provided on a weekly basis, covering required topics. Participant will state understanding/return demonstration of topics presented.  Learning Barriers/Preferences:     Learning Barriers/Preferences - 09/16/16 1536      Learning Barriers/Preferences   Learning Barriers None   Learning Preferences None      Education Topics: General Nutrition Guidelines/Fats and Fiber: -Group instruction provided by verbal, written material, models and posters to present the general guidelines for heart healthy nutrition. Gives an explanation and review of dietary fats and fiber.   Cardiac Rehab from 11/29/2016 in [facility] Cardiac and Pulmonary Rehab  Date  10/11/16  Educator  CR  Instruction Review Code  2- meets goals/outcomes      Controlling Sodium/Reading Food Labels: -Group verbal and written material supporting the discussion of sodium use in heart healthy nutrition. Review and explanation with models, verbal and written materials for utilization of the food label.   Cardiac Rehab from 11/29/2016 in [facility] Cardiac and Pulmonary Rehab  Date  10/18/16  Educator  CR  Instruction Review Code  2- meets goals/outcomes      Exercise Physiology & Risk Factors: - Group verbal and written instruction with models to review the exercise physiology of the cardiovascular system and associated critical values. Details cardiovascular disease risk factors and the goals associated with each risk factor.   Cardiac Rehab from 11/29/2016 in [facility] Cardiac and Pulmonary Rehab  Date  10/25/16  Educator  [facility]  Instruction Review Code  2- meets goals/outcomes      Aerobic Exercise & Resistance Training: - Gives group verbal and written discussion on the health impact of inactivity. On the components of aerobic and resistive training programs and the benefits of this training and how to safely progress  through these programs.   Cardiac Rehab from 11/29/2016 in [facility] Cardiac and Pulmonary Rehab  Date  10/27/16  Educator  [facility]  Instruction Review Code  2- meets goals/outcomes      Flexibility, Balance, General Exercise Guidelines: - Provides group verbal and written instruction on the benefits of flexibility and balance training programs. Provides general exercise guidelines with specific guidelines to those with heart or lung disease. Demonstration and skill practice provided.   Cardiac Rehab from 11/29/2016 in [facility] Cardiac and Pulmonary Rehab  Date  11/01/16  Educator  [facility]  Instruction Review  Code  2- meets goals/outcomes      Stress Management: - Provides group verbal and written instruction about the health risks of elevated stress, cause of high stress, and healthy ways to reduce stress.   Cardiac Rehab from 11/29/2016 in [facility] Cardiac and Pulmonary Rehab  Date  11/10/16  Educator  [facility]  Instruction Review Code  2- meets goals/outcomes      Depression: - Provides group verbal and written instruction on the correlation between heart/lung disease and depressed mood, treatment options, and the stigmas associated with seeking treatment.   Cardiac Rehab from 11/29/2016 in [facility] Cardiac and Pulmonary Rehab  Date  10/13/16  Educator  [facility]  Instruction Review Code  2- meets goals/outcomes      Anatomy & Physiology of the Heart: - Group verbal and written instruction and models provide basic cardiac anatomy and physiology, with the coronary electrical and arterial systems. Review of: AMI, Angina, Valve disease, Heart Failure, Cardiac Arrhythmia, Pacemakers, and the ICD.   Cardiac Procedures: - Group verbal and written instruction and models to describe the testing methods done to diagnose heart disease. Reviews the outcomes of the test results. Describes the treatment choices: Medical Management, Angioplasty, or Coronary Bypass Surgery.   Cardiac Rehab from 11/29/2016 in [facility] Cardiac and  Pulmonary Rehab  Date  11/15/16  Educator  CE  Instruction Review Code  2- meets goals/outcomes      Cardiac Medications: - Group verbal and written instruction to review commonly prescribed medications for heart disease. Reviews the medication, class of the drug, and side effects. Includes the steps to properly store meds and maintain the prescription regimen.   Cardiac Rehab from 11/29/2016 in [facility] Cardiac and Pulmonary Rehab  Date  11/24/16  Educator  SB  Instruction Review Code  1- partially meets, needs review/practice [part one]      Go Sex-Intimacy & Heart Disease, Get SMART - Goal Setting: - Group verbal and written instruction through game format to discuss heart disease and the return to sexual intimacy. Provides group verbal and written material to discuss and apply goal setting through the application of the S.M.A.R.T. Method.   Cardiac Rehab from 11/29/2016 in [facility] Cardiac and Pulmonary Rehab  Date  11/15/16  Educator  CE  Instruction Review Code  2- meets goals/outcomes      Other Matters of the Heart: - Provides group verbal, written materials and models to describe Heart Failure, Angina, Valve Disease, and Diabetes in the realm of heart disease. Includes description of the disease process and treatment options available to the cardiac patient.   Exercise & Equipment Safety: - Individual verbal instruction and demonstration of equipment use and safety with use of the equipment.   Cardiac Rehab from 11/29/2016 in [facility] Cardiac and Pulmonary Rehab  Date  09/16/16  Educator  PS  Instruction Review Code  2- meets goals/outcomes      Infection Prevention: - Provides verbal and written material to individual with discussion of infection control including proper hand washing and proper equipment cleaning during exercise session.   Cardiac Rehab from 11/29/2016 in [facility] Cardiac and Pulmonary Rehab  Date  09/16/16  Educator  PS  Instruction Review Code  2- meets  goals/outcomes      Falls Prevention: - Provides verbal and written material to individual with discussion of falls prevention and safety.   Cardiac Rehab from 11/29/2016 in [facility] Cardiac and Pulmonary Rehab  Date  09/16/16  Educator  PS  Instruction Review Code  2- meets  goals/outcomes      Diabetes: - Individual verbal and written instruction to review signs/symptoms of diabetes, desired ranges of glucose level fasting, after meals and with exercise. Advice that pre and post exercise glucose checks will be done for 3 sessions at entry of program.    Knowledge Questionnaire Score:     Knowledge Questionnaire Score - 11/26/16 1307      Knowledge Questionnaire Score   Pre Score 17/28   Post Score 26/28  test reviewed with pt today      Core Components/Risk Factors/Patient Goals at Admission:     Personal Goals and Risk Factors at Admission - 09/16/16 1518      Core Components/Risk Factors/Patient Goals on Admission    Weight Management Obesity;Weight Loss   Sedentary Yes   Intervention Provide advice, education, support and counseling about physical activity/exercise needs.;Develop an individualized exercise prescription for aerobic and resistive training based on initial evaluation findings, risk stratification, comorbidities and participant's personal goals.   Expected Outcomes Achievement of increased cardiorespiratory fitness and enhanced flexibility, muscular endurance and strength shown through measurements of functional capacity and personal statement of participant.   Increase Strength and Stamina Yes   Intervention Provide advice, education, support and counseling about physical activity/exercise needs.;Develop an individualized exercise prescription for aerobic and resistive training based on initial evaluation findings, risk stratification, comorbidities and participant's personal goals.   Expected Outcomes Achievement of increased cardiorespiratory fitness and  enhanced flexibility, muscular endurance and strength shown through measurements of functional capacity and personal statement of participant.   Heart Failure Yes   Intervention Provide a combined exercise and nutrition program that is supplemented with education, support and counseling about heart failure. Directed toward relieving symptoms such as shortness of breath, decreased exercise tolerance, and extremity edema.   Expected Outcomes Improve functional capacity of life;Short term: Attendance in program 2-3 days a week with increased exercise capacity. Reported lower sodium intake. Reported increased fruit and vegetable intake. Reports medication compliance.;Short term: Daily weights obtained and reported for increase. Utilizing diuretic protocols set by physician.;Long term: Adoption of self-care skills and reduction of barriers for early signs and symptoms recognition and intervention leading to self-care maintenance.   Lipids Yes   Intervention Provide education and support for participant on nutrition & aerobic/resistive exercise along with prescribed medications to achieve LDL '70mg'$ , HDL >'40mg'$ .   Expected Outcomes Short Term: Participant states understanding of desired cholesterol values and is compliant with medications prescribed. Participant is following exercise prescription and nutrition guidelines.;Long Term: Cholesterol controlled with medications as prescribed, with individualized exercise RX and with personalized nutrition plan. Value goals: LDL < '70mg'$ , HDL > 40 mg.   Stress Yes   Intervention Offer individual and/or small group education and counseling on adjustment to heart disease, stress management and health-related lifestyle change. Teach and support self-help strategies.   Expected Outcomes Short Term: Participant demonstrates changes in health-related behavior, relaxation and other stress management skills, ability to obtain effective social support, and compliance with  psychotropic medications if prescribed.;Long Term: Emotional wellbeing is indicated by absence of clinically significant psychosocial distress or social isolation.      Core Components/Risk Factors/Patient Goals Review:      Goals and Risk Factor Review    Row Name 10/25/16 0915 11/17/16 0851           Core Components/Risk Factors/Patient Goals Review   Personal Goals Review Weight Management/Obesity;Heart Failure;Hypertension;Lipids Weight Management/Obesity;Heart Failure;Hypertension;Lipids      Review Merrilee Seashore is off to a good start  with rehab.  His blood pressures have been good and he has not had any problems with taking his medications.  His weight has been fairly steady as he weighs daily and watches his sodium intake.  He has not had any heart failure symptoms. Merrilee Seashore continues to do well in rehab.  His weight is down to 165 lbs.   He continues to monitor for heart failure symptoms and has not had any of recent.  He did have an increased heart rate after a medication change, but otherwise has continued to do well.  Blood pressures have been good as well.      Expected Outcomes Short: Merrilee Seashore will continue to monitor his heart failure symptoms daily.  Long: Continue to attend rehab to work on risk factor modification. Short: Merrilee Seashore will continue to stay on top of his symptoms.  Long: Continue to work on weight loss.         Core Components/Risk Factors/Patient Goals at Discharge (Final Review):      Goals and Risk Factor Review - 11/17/16 0851      Core Components/Risk Factors/Patient Goals Review   Personal Goals Review Weight Management/Obesity;Heart Failure;Hypertension;Lipids   Review Merrilee Seashore continues to do well in rehab.  His weight is down to 165 lbs.   He continues to monitor for heart failure symptoms and has not had any of recent.  He did have an increased heart rate after a medication change, but otherwise has continued to do well.  Blood pressures have been good as well.   Expected  Outcomes Short: Merrilee Seashore will continue to stay on top of his symptoms.  Long: Continue to work on weight loss.      ITP Comments:     ITP Comments    Row Name 09/16/16 1506 10/06/16 0554 10/22/16 0851 11/03/16 0545 12/01/16 0541   ITP Comments Medical review completed, Initial ITP completed.  Diagnosis documentation is in [place] in Discharge Note from [facility] on 09/05/2016 30 day review. Continue with ITP unless directed changes per Medical Director review. New to program Merrilee Seashore increased his treadmill incline today to 2.5%.  30 day review. Continue with ITP unless directed changes per Medical Director review 30 day review. Continue with ITP unless directed changes per Medical Director review      Comments:

## 2016-12-01 NOTE — Progress Notes (Signed)
Daily Session Note  Patient Details  Name: Reginald Watkins MRN: [id number] Date of Birth: January 17, 1946 Referring Provider:     Cardiac Rehab from 10/11/2016 in [facility] Cardiac and Pulmonary Rehab  Referring Provider  Nehemiah Massed      Encounter Date: 12/01/2016  Check In:     Session Check In - 12/01/16 0803      Check-In   Location [facility]-Cardiac & Pulmonary Rehab   Staff Present Alberteen Sam, MA, ACSM RCEP, Exercise Physiologist;Susanne Bice, RN, BSN, Lance Sell, BA, ACSM CEP, Exercise Physiologist   Supervising physician immediately available to respond to emergencies See telemetry face sheet for immediately available ER MD   Medication changes reported     No   Fall or balance concerns reported    No   Warm-up and Cool-down Performed on first and last piece of equipment   Resistance Training Performed Yes   VAD Patient? No     Pain Assessment   Currently in Pain? No/denies   Multiple Pain Sites No         History  Smoking Status  . Former Smoker  . Types: Pipe  Smokeless Tobacco  . Former Systems developer  . Quit date: 1998    Goals Met:  Independence with exercise equipment Exercise tolerated well No report of cardiac concerns or symptoms Strength training completed today  Goals Unmet:  Not Applicable  Comments: Pt able to follow exercise prescription today without complaint.  Will continue to monitor for progression.    Dr. Emily Filbert is Medical Director for [place] [facility].

## 2016-12-03 ENCOUNTER — Encounter: Payer: Medicare HMO | Admitting: *Deleted

## 2016-12-03 DIAGNOSIS — Z953 Presence of xenogenic heart valve: Secondary | ICD-10-CM

## 2016-12-03 NOTE — Progress Notes (Signed)
Daily Session Note  Patient Details  Name: Reginald Watkins MRN: [id number] Date of Birth: 18-Feb-1946 Referring Provider:     Cardiac Rehab from 10/11/2016 in [facility] Cardiac and Pulmonary Rehab  Referring Provider  Nehemiah Massed      Encounter Date: 12/03/2016  Check In:     Session Check In - 12/03/16 0828      Check-In   Location [facility]-Cardiac & Pulmonary Rehab   Staff Present Nyoka Cowden, RN, BSN, MA;Renell Allum, RN, Levie Heritage, MA, ACSM RCEP, Exercise Physiologist   Supervising physician immediately available to respond to emergencies See telemetry face sheet for immediately available ER MD   Medication changes reported     No   Fall or balance concerns reported    Yes   Warm-up and Cool-down Performed on first and last piece of equipment   Resistance Training Performed Yes   VAD Patient? No     Pain Assessment   Currently in Pain? No/denies         History  Smoking Status  . Former Smoker  . Types: Pipe  Smokeless Tobacco  . Former Systems developer  . Quit date: 1998    Goals Met:  Proper associated with RPD/PD & O2 Sat Exercise tolerated well  Goals Unmet:  Not Applicable  Comments:     Dr. Emily Filbert is Medical Director for [place] [facility].

## 2016-12-03 NOTE — Patient Instructions (Signed)
Discharge Progress Notes Patient Details  Name: Reginald Watkins MRN: [id number] Date of Birth: 1946/05/31 Referring Provider:  Corey Skains, MD   Number of Visits: 36/36  Reason for Discharge:  Patient reached a stable level of exercise. Patient independent in their exercise.  Smoking History:  History  Smoking Status  . Former Smoker  . Types: Pipe  Smokeless Tobacco  . Former Systems developer  . Quit date: 1998    Diagnosis:  S/P heart valve replacement with bioprosthetic valve  Initial Exercise Prescription:     Initial Exercise Prescription - 10/11/16 0900      Date of Initial Exercise RX and Referring Provider   Date 09/16/16   Referring Provider Nehemiah Massed     Treadmill   MPH 1.5   Minutes 15   METs 2.5     Recumbant Bike   Level 1   RPM 60   Minutes 15   METs 2.5     NuStep   Level 2   Minutes 15   METs 2.5     Biostep-RELP   Level 2   Minutes 15   METs 2.5     Prescription Details   Frequency (times per week) 3     Intensity   THRR 40-80% of Max Heartrate 118-140     Resistance Training   Training Prescription Yes   Weight 2      Discharge Exercise Prescription (Final Exercise Prescription Changes):     Exercise Prescription Changes - 11/25/16 1100      Response to Exercise   Blood Pressure (Admit) 104/60   Blood Pressure (Exercise) 134/72   Blood Pressure (Exit) 118/70   Heart Rate (Admit) 77 bpm   Heart Rate (Exercise) 137 bpm   Heart Rate (Exit) 93 bpm   Rating of Perceived Exertion (Exercise) 14   Symptoms none   Duration Continue with 45 min of aerobic exercise without signs/symptoms of physical distress.   Intensity THRR unchanged     Progression   Progression Continue to progress workloads to maintain intensity without signs/symptoms of physical distress.   Average METs 5.57     Resistance Training   Training Prescription Yes   Weight 4 lbs   Reps 10-15     Interval Training   Interval Training Yes   Equipment  NuStep;Biostep-RELP   Comments 2 min 30 sec     Treadmill   MPH 3.5   Grade 6   Minutes 15   METs 6.57     NuStep   Level 5   Minutes 15   METs 5     Biostep-RELP   Level 4   Minutes 15   METs 5     Home Exercise Plan   Plans to continue exercise at [employer] (comment)   Frequency Add 2 additional days to program exercise sessions.   Initial Home Exercises Provided 11/10/16      Functional Capacity:     6 Minute Walk    Row Name 09/16/16 1346 11/24/16 1146       6 Minute Walk   Phase  - Discharge    Distance 1030 feet 1586 feet    Distance % Change  - 53.9 %  556 ft    Walk Time 6 minutes 6 minutes    # of Rest Breaks 0  -    MPH 1.95 3    METS 2.64 3.76    RPE 7 12    VO2 Peak 9.24 13.17  Symptoms No No    Resting HR 109 bpm 77 bpm    Resting BP 104/60 104/60    Max Ex. HR 129 bpm 130 bpm    Max Ex. BP 134/72 148/82       Quality of Life:     Quality of Life - 11/26/16 1310      Quality of Life Scores   Health/Function Pre 27.43 %   Health/Function Post 28.4 %   Health/Function % Change 3.54 %   Socioeconomic Pre 30 %   Socioeconomic Post 29.64 %   Socioeconomic % Change  -1.2 %   Psych/Spiritual Pre 30 %   Psych/Spiritual Post 30 %   Psych/Spiritual % Change 0 %   Family Pre 30 %   Family Post 28.8 %   Family % Change -4 %   GLOBAL Pre 28.91 %   GLOBAL Post 29.04 %   GLOBAL % Change 0.45 %      Personal Goals: Goals established at orientation with interventions provided to work toward goal.     Personal Goals and Risk Factors at Admission - 09/16/16 1518      Core Components/Risk Factors/Patient Goals on Admission    Weight Management Obesity;Weight Loss   Sedentary Yes   Intervention Provide advice, education, support and counseling about physical activity/exercise needs.;Develop an individualized exercise prescription for aerobic and resistive training based on initial evaluation findings, risk stratification,  comorbidities and participant's personal goals.   Expected Outcomes Achievement of increased cardiorespiratory fitness and enhanced flexibility, muscular endurance and strength shown through measurements of functional capacity and personal statement of participant.   Increase Strength and Stamina Yes   Intervention Provide advice, education, support and counseling about physical activity/exercise needs.;Develop an individualized exercise prescription for aerobic and resistive training based on initial evaluation findings, risk stratification, comorbidities and participant's personal goals.   Expected Outcomes Achievement of increased cardiorespiratory fitness and enhanced flexibility, muscular endurance and strength shown through measurements of functional capacity and personal statement of participant.   Heart Failure Yes   Intervention Provide a combined exercise and nutrition program that is supplemented with education, support and counseling about heart failure. Directed toward relieving symptoms such as shortness of breath, decreased exercise tolerance, and extremity edema.   Expected Outcomes Improve functional capacity of life;Short term: Attendance in program 2-3 days a week with increased exercise capacity. Reported lower sodium intake. Reported increased fruit and vegetable intake. Reports medication compliance.;Short term: Daily weights obtained and reported for increase. Utilizing diuretic protocols set by physician.;Long term: Adoption of self-care skills and reduction of barriers for early signs and symptoms recognition and intervention leading to self-care maintenance.   Lipids Yes   Intervention Provide education and support for participant on nutrition & aerobic/resistive exercise along with prescribed medications to achieve LDL 70mg , HDL >40mg .   Expected Outcomes Short Term: Participant states understanding of desired cholesterol values and is compliant with medications prescribed.  Participant is following exercise prescription and nutrition guidelines.;Long Term: Cholesterol controlled with medications as prescribed, with individualized exercise RX and with personalized nutrition plan. Value goals: LDL < 70mg , HDL > 40 mg.   Stress Yes   Intervention Offer individual and/or small group education and counseling on adjustment to heart disease, stress management and health-related lifestyle change. Teach and support self-help strategies.   Expected Outcomes Short Term: Participant demonstrates changes in health-related behavior, relaxation and other stress management skills, ability to obtain effective social support, and compliance with psychotropic medications if prescribed.;Long Term: Emotional  wellbeing is indicated by absence of clinically significant psychosocial distress or social isolation.       Personal Goals Discharge:     Goals and Risk Factor Review - 11/17/16 0851      Core Components/Risk Factors/Patient Goals Review   Personal Goals Review Weight Management/Obesity;Heart Failure;Hypertension;Lipids   Review Merrilee Seashore continues to do well in rehab.  His weight is down to 165 lbs.   He continues to monitor for heart failure symptoms and has not had any of recent.  He did have an increased heart rate after a medication change, but otherwise has continued to do well.  Blood pressures have been good as well.   Expected Outcomes Short: Merrilee Seashore will continue to stay on top of his symptoms.  Long: Continue to work on weight loss.      Nutrition & Weight - Outcomes:     Pre Biometrics - 09/16/16 1344      Pre Biometrics   Height 5' 3.5" (1.613 m)   Weight 176 lb (79.8 kg)   Waist Circumference 39 inches   Hip Circumference 42 inches   Waist to Hip Ratio 0.93 %   BMI (Calculated) 30.8   Single Leg Stand 17.55 seconds         Post Biometrics - 11/24/16 1145       Post  Biometrics   Height 5' 3.5" (1.613 m)   Weight 166 lb (75.3 kg)   Waist Circumference 39  inches   Hip Circumference 41 inches   Waist to Hip Ratio 0.95 %   BMI (Calculated) 29   Single Leg Stand 30 seconds      Nutrition:     Nutrition Therapy & Goals - 11/15/16 1406      Nutrition Therapy   Diet Instructed on a meal plan based on 1600 calories including heart healthy dietary guidelines.   Drug/Food Interactions Statins/Certain Fruits   Protein (specify units) 7   Fiber 30 grams   Whole Grain Foods 3 servings   Saturated Fats 11 max. grams   Fruits and Vegetables 5 servings/day   Sodium 1500 grams     Personal Nutrition Goals   Nutrition Goal Increase fruits and vegetables to at least 5 servings daily.   Personal Goal #2 Read labels for saturated fat, trans fat and sodium.   Personal Goal #3 Continue with controlling portions at meals and at evening snack.     Intervention Plan   Intervention Prescribe, educate and counsel regarding individualized specific dietary modifications aiming towards targeted core components such as weight, hypertension, lipid management, diabetes, heart failure and other comorbidities.;Nutrition handout(s) given to patient.   Expected Outcomes Short Term Goal: Understand basic principles of dietary content, such as calories, fat, sodium, cholesterol and nutrients.;Short Term Goal: A plan has been developed with personal nutrition goals set during dietitian appointment.;Long Term Goal: Adherence to prescribed nutrition plan.      Nutrition Discharge:     Nutrition Assessments - 11/26/16 1307      MEDFICTS Scores   Pre Score 36   Post Score 33   Score Difference -3      Education Questionnaire Score:     Knowledge Questionnaire Score - 11/26/16 1307      Knowledge Questionnaire Score   Pre Score 17/28   Post Score 26/28  test reviewed with pt today      Goals reviewed with patient; copy given to patient.

## 2016-12-06 ENCOUNTER — Encounter: Payer: Medicare HMO | Admitting: *Deleted

## 2016-12-06 DIAGNOSIS — Z953 Presence of xenogenic heart valve: Secondary | ICD-10-CM

## 2016-12-06 NOTE — Progress Notes (Signed)
Discharge Summary  Patient Details  Name: Reginald Watkins MRN: [id number] Date of Birth: Mar 13, 1946 Referring Provider:     Cardiac Rehab from 10/11/2016 in [facility]  Referring Provider  Nehemiah Massed       Number of Visits: 36/36  Reason for Discharge:  Patient reached a stable level of exercise. Patient independent in their exercise.  Smoking History:  History  Smoking Status  . Former Smoker  . Types: Pipe  Smokeless Tobacco  . Former Systems developer  . Quit date: 1998    Diagnosis:  S/P heart valve replacement with bioprosthetic valve  ADL UCSD:   Initial Exercise Prescription:     Initial Exercise Prescription - 10/11/16 0900      Date of Initial Exercise RX and Referring Provider   Date 09/16/16   Referring Provider Nehemiah Massed     Treadmill   MPH 1.5   Minutes 15   METs 2.5     Recumbant Bike   Level 1   RPM 60   Minutes 15   METs 2.5     NuStep   Level 2   Minutes 15   METs 2.5     Biostep-RELP   Level 2   Minutes 15   METs 2.5     Prescription Details   Frequency (times per week) 3     Intensity   THRR 40-80% of Max Heartrate 118-140     Resistance Training   Training Prescription Yes   Weight 2      Discharge Exercise Prescription (Final Exercise Prescription Changes):     Exercise Prescription Changes - 11/25/16 1100      Response to Exercise   Blood Pressure (Admit) 104/60   Blood Pressure (Exercise) 134/72   Blood Pressure (Exit) 118/70   Heart Rate (Admit) 77 bpm   Heart Rate (Exercise) 137 bpm   Heart Rate (Exit) 93 bpm   Rating of Perceived Exertion (Exercise) 14   Symptoms none   Duration Continue with 45 min of aerobic exercise without signs/symptoms of physical distress.   Intensity THRR unchanged     Progression   Progression Continue to progress workloads to maintain intensity without signs/symptoms of physical distress.   Average METs 5.57     Resistance Training   Training Prescription Yes   Weight 4 lbs   Reps 10-15     Interval Training   Interval Training Yes   Equipment NuStep;Biostep-RELP   Comments 2 min 30 sec     Treadmill   MPH 3.5   Grade 6   Minutes 15   METs 6.57     NuStep   Level 5   Minutes 15   METs 5     Biostep-RELP   Level 4   Minutes 15   METs 5     Home Exercise Plan   Plans to continue exercise at [employer] (comment)   Frequency Add 2 additional days to program exercise sessions.   Initial Home Exercises Provided 11/10/16      Functional Capacity:     6 Minute Walk    Row Name 09/16/16 1346 11/24/16 1146       6 Minute Walk   Phase  - Discharge    Distance 1030 feet 1586 feet    Distance % Change  - 53.9 %  556 ft    Walk Time 6 minutes 6 minutes    # of Rest Breaks 0  -    MPH 1.95 3  METS 2.64 3.76    RPE 7 12    VO2 Peak 9.24 13.17    Symptoms No No    Resting HR 109 bpm 77 bpm    Resting BP 104/60 104/60    Max Ex. HR 129 bpm 130 bpm    Max Ex. BP 134/72 148/82       Psychological, QOL, Others - Outcomes: PHQ 2/9: Depression screen [facility] 2/9 11/26/2016 09/16/2016  Decreased Interest 0 2  Down, Depressed, Hopeless 0 0  PHQ - 2 Score 0 2  Altered sleeping 1 1  Tired, decreased energy 1 3  Change in appetite 0 1  Feeling bad or failure about yourself  0 0  Trouble concentrating 0 0  Moving slowly or fidgety/restless 0 0  Suicidal thoughts 0 0  PHQ-9 Score 2 7  Difficult doing work/chores Not difficult at all Not difficult at all    Quality of Life:     Quality of Life - 11/26/16 1310      Quality of Life Scores   Health/Function Pre 27.43 %   Health/Function Post 28.4 %   Health/Function % Change 3.54 %   Socioeconomic Pre 30 %   Socioeconomic Post 29.64 %   Socioeconomic % Change  -1.2 %   Psych/Spiritual Pre 30 %   Psych/Spiritual Post 30 %   Psych/Spiritual % Change 0 %   Family Pre 30 %   Family Post 28.8 %   Family % Change -4 %   GLOBAL Pre 28.91 %   GLOBAL Post 29.04 %    GLOBAL % Change 0.45 %      Personal Goals: Goals established at orientation with interventions provided to work toward goal.     Personal Goals and Risk Factors at Admission - 09/16/16 1518      Core Components/Risk Factors/Patient Goals on Admission    Weight Management Obesity;Weight Loss   Sedentary Yes   Intervention Provide advice, education, support and counseling about physical activity/exercise needs.;Develop an individualized exercise prescription for aerobic and resistive training based on initial evaluation findings, risk stratification, comorbidities and participant's personal goals.   Expected Outcomes Achievement of increased cardiorespiratory fitness and enhanced flexibility, muscular endurance and strength shown through measurements of functional capacity and personal statement of participant.   Increase Strength and Stamina Yes   Intervention Provide advice, education, support and counseling about physical activity/exercise needs.;Develop an individualized exercise prescription for aerobic and resistive training based on initial evaluation findings, risk stratification, comorbidities and participant's personal goals.   Expected Outcomes Achievement of increased cardiorespiratory fitness and enhanced flexibility, muscular endurance and strength shown through measurements of functional capacity and personal statement of participant.   Heart Failure Yes   Intervention Provide a combined exercise and nutrition program that is supplemented with education, support and counseling about heart failure. Directed toward relieving symptoms such as shortness of breath, decreased exercise tolerance, and extremity edema.   Expected Outcomes Improve functional capacity of life;Short term: Attendance in program 2-3 days a week with increased exercise capacity. Reported lower sodium intake. Reported increased fruit and vegetable intake. Reports medication compliance.;Short term: Daily weights  obtained and reported for increase. Utilizing diuretic protocols set by physician.;Long term: Adoption of self-care skills and reduction of barriers for early signs and symptoms recognition and intervention leading to self-care maintenance.   Lipids Yes   Intervention Provide education and support for participant on nutrition & aerobic/resistive exercise along with prescribed medications to achieve LDL 70mg , HDL >40mg .  Expected Outcomes Short Term: Participant states understanding of desired cholesterol values and is compliant with medications prescribed. Participant is following exercise prescription and nutrition guidelines.;Long Term: Cholesterol controlled with medications as prescribed, with individualized exercise RX and with personalized nutrition plan. Value goals: LDL < 70mg , HDL > 40 mg.   Stress Yes   Intervention Offer individual and/or small group education and counseling on adjustment to heart disease, stress management and health-related lifestyle change. Teach and support self-help strategies.   Expected Outcomes Short Term: Participant demonstrates changes in health-related behavior, relaxation and other stress management skills, ability to obtain effective social support, and compliance with psychotropic medications if prescribed.;Long Term: Emotional wellbeing is indicated by absence of clinically significant psychosocial distress or social isolation.       Personal Goals Discharge:     Goals and Risk Factor Review    Row Name 10/25/16 0915 11/17/16 0851           Core Components/Risk Factors/Patient Goals Review   Personal Goals Review Weight Management/Obesity;Heart Failure;Hypertension;Lipids Weight Management/Obesity;Heart Failure;Hypertension;Lipids      Review Merrilee Seashore is off to a good start with rehab.  His blood pressures have been good and he has not had any problems with taking his medications.  His weight has been fairly steady as he weighs daily and watches his  sodium intake.  He has not had any heart failure symptoms. Merrilee Seashore continues to do well in rehab.  His weight is down to 165 lbs.   He continues to monitor for heart failure symptoms and has not had any of recent.  He did have an increased heart rate after a medication change, but otherwise has continued to do well.  Blood pressures have been good as well.      Expected Outcomes Short: Merrilee Seashore will continue to monitor his heart failure symptoms daily.  Long: Continue to attend rehab to work on risk factor modification. Short: Merrilee Seashore will continue to stay on top of his symptoms.  Long: Continue to work on weight loss.         Nutrition & Weight - Outcomes:     Pre Biometrics - 09/16/16 1344      Pre Biometrics   Height 5' 3.5" (1.613 m)   Weight 176 lb (79.8 kg)   Waist Circumference 39 inches   Hip Circumference 42 inches   Waist to Hip Ratio 0.93 %   BMI (Calculated) 30.8   Single Leg Stand 17.55 seconds         Post Biometrics - 11/24/16 1145       Post  Biometrics   Height 5' 3.5" (1.613 m)   Weight 166 lb (75.3 kg)   Waist Circumference 39 inches   Hip Circumference 41 inches   Waist to Hip Ratio 0.95 %   BMI (Calculated) 29   Single Leg Stand 30 seconds      Nutrition:     Nutrition Therapy & Goals - 11/15/16 1406      Nutrition Therapy   Diet Instructed on a meal plan based on 1600 calories including heart healthy dietary guidelines.   Drug/Food Interactions Statins/Certain Fruits   Protein (specify units) 7   Fiber 30 grams   Whole Grain Foods 3 servings   Saturated Fats 11 max. grams   Fruits and Vegetables 5 servings/day   Sodium 1500 grams     Personal Nutrition Goals   Nutrition Goal Increase fruits and vegetables to at least 5 servings daily.   Personal Goal #2  Read labels for saturated fat, trans fat and sodium.   Personal Goal #3 Continue with controlling portions at meals and at evening snack.     Intervention Plan   Intervention Prescribe, educate and  counsel regarding individualized specific dietary modifications aiming towards targeted core components such as weight, hypertension, lipid management, diabetes, heart failure and other comorbidities.;Nutrition handout(s) given to patient.   Expected Outcomes Short Term Goal: Understand basic principles of dietary content, such as calories, fat, sodium, cholesterol and nutrients.;Short Term Goal: A plan has been developed with personal nutrition goals set during dietitian appointment.;Long Term Goal: Adherence to prescribed nutrition plan.      Nutrition Discharge:     Nutrition Assessments - 11/26/16 1307      MEDFICTS Scores   Pre Score 36   Post Score 33   Score Difference -3      Education Questionnaire Score:     Knowledge Questionnaire Score - 11/26/16 1307      Knowledge Questionnaire Score   Pre Score 17/28   Post Score 26/28  test reviewed with pt today      Goals reviewed with patient; copy given to patient.

## 2016-12-06 NOTE — Progress Notes (Signed)
Cardiac Individual Treatment Plan  Patient Details  Name: Reginald Watkins MRN: [id number] Date of Birth: 1945-09-14 Referring Provider:     Cardiac Rehab from 10/11/2016 in [facility] Cardiac and Pulmonary Rehab  Referring Provider  Nehemiah Massed      Initial Encounter Date:    Cardiac Rehab from 10/11/2016 in [facility] Cardiac and Pulmonary Rehab  Date  09/16/16  Referring Provider  Nehemiah Massed      Visit Diagnosis: S/P heart valve replacement with bioprosthetic valve  Patient's Home Medications on Admission:  Current Outpatient Prescriptions:  .  aspirin (GOODSENSE ASPIRIN) 325 MG tablet, Take 325 mg by mouth daily., Disp: , Rfl:  .  aspirin 81 MG chewable tablet, Chew 81 mg by mouth daily., Disp: , Rfl:  .  atorvastatin (LIPITOR) 40 MG tablet, Take 40 mg by mouth daily., Disp: , Rfl:  .  furosemide (LASIX) 20 MG tablet, Take 20 mg by mouth 2 (two) times daily., Disp: , Rfl:  .  Glucosamine-Chondroitin 250-200 MG TABS, Take 1 tablet by mouth daily., Disp: , Rfl:  .  metoprolol tartrate (LOPRESSOR) 25 MG tablet, Take 25 mg by mouth every 12 (twelve) hours., Disp: , Rfl:  .  Misc Natural Products (OSTEO BI-FLEX JOINT SHIELD) TABS, Take 1 tablet by mouth daily., Disp: , Rfl:  .  Multiple Vitamin (MULTIVITAMIN) capsule, Take 1 capsule by mouth daily., Disp: , Rfl:  .  pantoprazole (PROTONIX) 40 MG tablet, Take 40 mg by mouth daily., Disp: , Rfl:  .  potassium chloride SA (K-DUR,KLOR-CON) 20 MEQ tablet, Take 20 mEq by mouth every 12 (twelve) hours., Disp: , Rfl:   Past Medical History: Past Medical History:  Diagnosis Date  . 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 TIA (transient ischemic attack)     Tobacco Use: History  Smoking Status  . Former Smoker  . Types: Pipe  Smokeless Tobacco  . Former Systems developer  . Quit date: 1998    Labs: Recent Review Flowsheet Data    There is no flowsheet data to display.       Exercise Target Goals:    Exercise Program Goal: Individual  exercise prescription set with THRR, safety & activity barriers. Participant demonstrates ability to understand and report RPE using BORG scale, to self-measure pulse accurately, and to acknowledge the importance of the exercise prescription.  Exercise Prescription Goal: Starting with aerobic activity 30 plus minutes a day, 3 days per week for initial exercise prescription. Provide home exercise prescription and guidelines that participant acknowledges understanding prior to discharge.  Activity Barriers & Risk Stratification:     Activity Barriers & Cardiac Risk Stratification - 09/16/16 1535      Activity Barriers & Cardiac Risk Stratification   Activity Barriers Arthritis;Neck/Spine Problems   Cardiac Risk Stratification High      6 Minute Walk:     6 Minute Walk    Row Name 09/16/16 1346 11/24/16 1146       6 Minute Walk   Phase  - Discharge    Distance 1030 feet 1586 feet    Distance % Change  - 53.9 %  556 ft    Walk Time 6 minutes 6 minutes    # of Rest Breaks 0  -    MPH 1.95 3    METS 2.64 3.76    RPE 7 12    VO2 Peak 9.24 13.17    Symptoms No No    Resting HR 109 bpm 77  bpm    Resting BP 104/60 104/60    Max Ex. HR 129 bpm 130 bpm    Max Ex. BP 134/72 148/82       Oxygen Initial Assessment:   Oxygen Re-Evaluation:   Oxygen Discharge (Final Oxygen Re-Evaluation):   Initial Exercise Prescription:     Initial Exercise Prescription - 10/11/16 0900      Date of Initial Exercise RX and Referring Provider   Date 09/16/16   Referring Provider Nehemiah Massed     Treadmill   MPH 1.5   Minutes 15   METs 2.5     Recumbant Bike   Level 1   RPM 60   Minutes 15   METs 2.5     NuStep   Level 2   Minutes 15   METs 2.5     Biostep-RELP   Level 2   Minutes 15   METs 2.5     Prescription Details   Frequency (times per week) 3     Intensity   THRR 40-80% of Max Heartrate 118-140     Resistance Training   Training Prescription Yes   Weight 2       Perform Capillary Blood Glucose checks as needed.  Exercise Prescription Changes:      Exercise Prescription Changes    Row Name 10/12/16 1500 10/26/16 1500 11/10/16 0800 11/10/16 1500 11/25/16 1100     Response to Exercise   Blood Pressure (Admit) 128/72 146/74 146/74 108/62 104/60   Blood Pressure (Exercise) 138/74 148/64 148/64 136/80 134/72   Blood Pressure (Exit) 110/72 124/64 124/64 118/72 118/70   Heart Rate (Admit) 113 bpm 57 bpm 57 bpm 97 bpm 77 bpm   Heart Rate (Exercise) 134 bpm 141 bpm 141 bpm 140 bpm 137 bpm   Heart Rate (Exit) 87 bpm 106 bpm 106 bpm 89 bpm 93 bpm   Rating of Perceived Exertion (Exercise) _0 Symptoms _1    Duration Progress to 45 minutes of aerobic exercise without signs/symptoms of physical distress Continue with 45 min of aerobic exercise without signs/symptoms of physical distress. Continue with 45 min of aerobic exercise without signs/symptoms of physical distress. Continue with 45 min of aerobic exercise without signs/symptoms of physical distress. Continue with 45 min of aerobic exercise without signs/symptoms of physical distress.   Intensity _2      Progression   Progression Continue to progress workloads to maintain intensity without signs/symptoms of physical distress. Continue to progress workloads to maintain intensity without signs/symptoms of physical distress. Continue to progress workloads to maintain intensity without signs/symptoms of physical distress. Continue to progress workloads to maintain intensity without signs/symptoms of physical distress. Continue to progress workloads to maintain intensity without signs/symptoms of physical distress.   Average METs  - 3.85 3.85 6.13 5.57     Resistance Training   Training Prescription _3    Weight 2 lbs 4 lbs 4 lbs 4 lbs 4 lbs   Reps 10-15 10-15 10-15 10-15 10-15      Interval Training   Interval Training No No No No Yes   Equipment  -  -  -  - NuStep;Biostep-RELP   Comments  -  -  -  - 2 min 30 sec     Treadmill   MPH  - 3.2 3.2 3.5 3.5   Grade  - 2.5 2._4 Minutes  -  _0 METs  - 4.44 4.44 6.09 6.57     NuStep   Level _1 Minutes _2 METs 3._3 5.3 5     Biostep-RELP   Level _4 Minutes _5 METs _6 Home Exercise Plan   Plans to continue exercise at  -  - [employer] (comment) Forensic scientist (comment) Forensic scientist (comment)   Frequency  -  - Add 2 additional days to program exercise sessions. Add 2 additional days to program exercise sessions. Add 2 additional days to program exercise sessions.   Initial Home Exercises Provided  -  - 11/10/16 11/10/16 11/10/16      Exercise Comments:      Exercise Comments    Row Name 10/11/16 8403 10/22/16 0851 11/10/16 0846 11/12/16 0845 11/15/16 0951   Exercise Comments First full day of exercise!  Patient was oriented to gym and equipment including functions, settings, policies, and procedures.  Patient's individual exercise prescription and treatment plan were reviewed.  All starting workloads were established based on the results of the 6 minute walk test done at initial orientation visit.  The plan for exercise progression was also introduced and progression will be customized based on patient's performance and goals Reginald Watkins increased his treadmill incline today to 2.5%.  Reviewed home exercise with pt today.  Pt plans to walk and join [employer] for exercise.  Reviewed THR, pulse, RPE, sign and symptoms, NTG use, and when to call 911 or MD.  Also discussed weather considerations and indoor options.  Pt voiced understanding. Reviewed METs average and discussed progression with pt today. [name]'s heart rate was elevated today.  He was over his target heart rate on the treadmill and then took a while to recover after  exercise.  Note and strips were sent Dr. Nehemiah Massed for review and see request a target heart rate incease.   [place] Name 11/29/16 0845 12/06/16 0839         Exercise Comments Reviewed METs average and discussed progression with pt today. Caedmon graduated today from cardiac rehab with 36 sessions completed.  Details of the patient's exercise prescription and what He needs to do in order to continue the prescription and progress were discussed with patient.  Patient was given a copy of prescription and goals.  Patient verbalized understanding.  Adaiah plans to continue to exercise by exercising at [employer].         Exercise Goals and Review:   Exercise Goals Re-Evaluation :     Exercise Goals Re-Evaluation    Row Name 10/12/16 1541 10/25/16 0914 11/10/16 1518 11/17/16 0850 11/25/16 1149     Exercise Goal Re-Evaluation   Exercise Goals Review Increase Physical Activity Increase Physical Activity;Increase Strenth and Stamina Increase Physical Activity;Increase Strenth and Stamina Increase Physical Activity;Increase Strenth and Stamina Increase Physical Activity;Increase Strenth and Stamina   Comments Reginald Watkins has completed his first full day of exercise!!  He is off to a good start! Reginald Watkins is feeling stronger and better overall.  He seems to be enjoying rehab.  He is walking at home two extra days a week. Reginald Watkins continues to do well in rehab.  He is now up to 3.5 mph and 5% grade.  We will continue to monitor his progress. Reginald Watkins is feeling stronger and has more stamina.  He was able to play 18 holes of golf yesterday!!  He is also walking at home for exercise. Reginald Watkins continues to do well in rehab.  He is feeling even better now after changing his beta blocker medication to once a day.  He is now walking up 6% incline on the treadmill.  He also improved his walk test by 577f!!  He will be graduating soon, but we will continue to monitor him.   Expected Outcomes Short: Attend rehab classes regularly   Long: Become independent with doing more physical activity Short: Review home exercise guidelines.  Long: Continue to attend classes to work on strength and stamina. Short: Start exercising some on his own outside of class.  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 Long: Continue to stay active and build strength and stamina.      Discharge Exercise Prescription (Final Exercise Prescription Changes):     Exercise Prescription Changes - 11/25/16 1100      Response to Exercise   Blood Pressure (Admit) 104/60   Blood Pressure (Exercise) 134/72   Blood Pressure (Exit) 118/70   Heart Rate (Admit) 77 bpm   Heart Rate (Exercise) 137 bpm   Heart Rate (Exit) 93 bpm   Rating of Perceived Exertion (Exercise) 14   Symptoms none   Duration Continue with 45 min of aerobic exercise without signs/symptoms of physical distress.   Intensity THRR unchanged     Progression   Progression Continue to progress workloads to maintain intensity without signs/symptoms of physical distress.   Average METs 5.57     Resistance Training   Training Prescription Yes   Weight 4 lbs   Reps 10-15     Interval Training   Interval Training Yes   Equipment NuStep;Biostep-RELP   Comments 2 min 30 sec     Treadmill   MPH 3.5   Grade 6   Minutes 15   METs 6.57     NuStep   Level 5   Minutes 15   METs 5     Biostep-RELP   Level 4   Minutes 15   METs 5     Home Exercise Plan   [facility] to continue [facility] at [employer](comment)   Frequency Add 2 additional days to program exercise sessions.   Initial Home Exercises Provided 11/10/16      Nutrition:  Target Goals: Understanding of nutrition guidelines, daily intake of sodium '1500mg'$ , cholesterol '200mg'$ , calories 30% from fat and 7% or less from saturated fats, daily to have 5 or more servings of fruits and  vegetables.  Biometrics:     Pre Biometrics - 09/16/16 1344      Pre Biometrics   Height 5' 3.5" (1.613 m)   Weight 176 lb (79.8 kg)   Waist Circumference 39 inches   Hip Circumference 42 inches   Waist to Hip Ratio 0.93 %   BMI (Calculated) 30.8   Single Leg Stand 17.55 seconds         Post Biometrics - 11/24/16 1145       Post  Biometrics   Height 5' 3.5" (1.613 m)   Weight 166 lb (75.3 kg)   Waist Circumference 39 inches   Hip Circumference 41 inches   Waist to Hip Ratio 0.95 %   BMI (Calculated) 29   Single Leg Stand 30 seconds  Nutrition Therapy Plan and Nutrition Goals:     Nutrition Therapy & Goals - 11/15/16 1406      Nutrition Therapy   Diet Instructed on a meal plan based on 1600 calories including heart healthy dietary guidelines.   Drug/Food Interactions Statins/Certain Fruits   Protein (specify units) 7   Fiber 30 grams   Whole Grain Foods 3 servings   Saturated Fats 11 max. grams   Fruits and Vegetables 5 servings/day   Sodium 1500 grams     Personal Nutrition Goals   Nutrition Goal Increase fruits and vegetables to at least 5 servings daily.   Personal Goal #2 Read labels for saturated fat, trans fat and sodium.   Personal Goal #3 Continue with controlling portions at meals and at evening snack.     Intervention Plan   Intervention Prescribe, educate and counsel regarding individualized specific dietary modifications aiming towards targeted core components such as weight, hypertension, lipid management, diabetes, heart failure and other comorbidities.;Nutrition handout(s) given to patient.   Expected Outcomes Short Term Goal: Understand basic principles of dietary content, such as calories, fat, sodium, cholesterol and nutrients.;Short Term Goal: A plan has been developed with personal nutrition goals set during dietitian appointment.;Long Term Goal: Adherence to prescribed nutrition plan.      Nutrition Discharge: Rate Your Plate Scores:      Nutrition Assessments - 11/26/16 1307      MEDFICTS Scores   Pre Score 36   Post Score 33   Score Difference -3      Nutrition Goals Re-Evaluation:     Nutrition Goals Re-Evaluation    Row Name 10/25/16 0918 11/17/16 0910           Goals   Current Weight 165 lb 11.2 oz (75.2 kg) 165 lb (74.8 kg)      Comment Appt scheduled for 4/9 after class Meet on Monday for appt.  Felt that appointment was worthwhile and will continue to focus on healthy eating.      Expected Outcome  - Improved heart healthy lifestyle         Nutrition Goals Discharge (Final Nutrition Goals Re-Evaluation):     Nutrition Goals Re-Evaluation - 11/17/16 0910      Goals   Current Weight 165 lb (74.8 kg)   Comment Meet on Monday for appt.  Felt that appointment was worthwhile and will continue to focus on healthy eating.   Expected Outcome Improved heart healthy lifestyle      Psychosocial: Target Goals: Acknowledge presence or absence of significant depression and/or stress, maximize coping skills, provide positive support system. Participant is able to verbalize types and ability to use techniques and skills needed for reducing stress and depression.   Initial Review & Psychosocial Screening:     Initial Psych Review & Screening - 09/16/16 1529      Initial Review   Current issues with Current Sleep Concerns  patient states he wakes up throughtout the night because he cannot get comfortable.      Family Dynamics   Good Support System? Yes     Barriers   Psychosocial barriers to participate in program The patient should benefit from training in stress management and relaxation.     Screening Interventions   Interventions Encouraged to exercise;Program counselor consult      Quality of Life Scores:      Quality of Life - 11/26/16 1310      Quality of Life Scores   Health/Function Pre 27.43 %  Health/Function Post 28.4 %   Health/Function % Change 3.54 %   Socioeconomic Pre 30  %   Socioeconomic Post 29.64 %   Socioeconomic % Change  -1.2 %   Psych/Spiritual Pre 30 %   Psych/Spiritual Post 30 %   Psych/Spiritual % Change 0 %   Family Pre 30 %   Family Post 28.8 %   Family % Change -4 %   GLOBAL Pre 28.91 %   GLOBAL Post 29.04 %   GLOBAL % Change 0.45 %      PHQ-9: Recent Review Flowsheet Data    Depression screen [facility] 2/9 11/26/2016 09/16/2016   Decreased Interest 0 2   Down, Depressed, Hopeless 0 0   PHQ - 2 Score 0 2   Altered sleeping 1 1   Tired, decreased energy 1 3   Change in appetite 0 1   Feeling bad or failure about yourself  0 0   Trouble concentrating 0 0   Moving slowly or fidgety/restless 0 0   Suicidal thoughts 0 0   PHQ-9 Score 2 7   Difficult doing work/chores Not difficult at all Not difficult at all     Interpretation of Total Score  Total Score Depression Severity:  1-4 = Minimal depression, 5-9 = Mild depression, 10-14 = Moderate depression, 15-19 = Moderately severe depression, 20-27 = Severe depression   Psychosocial Evaluation and Intervention:     Psychosocial Evaluation - 10/27/16 0939      Psychosocial Evaluation & Interventions   Comments Counselor met with Mr. Veley today for initial psychosocial evaluation.  He is a 71 year old who began this program several weeks ago.  He reports already noticing progress with his ability to walk up hill without as much shortness of breath and he has increased his strength and stamina as his intensity and duration has increased on the equipment.  He contines to have ongoing concerns about sleep and his Dr. prescribed a Rx for this recently which he took over the weekend.  He did sleep better; however, he had some negative side effects with dizziness and the Dr. discontinued this Rx this past Monday as a result and also because Reginald Watkins has had a stroke history.  Counselor discussed checking with his Dr. or pharmacist about whether he could try a natural over the counter supplement to help  with sleep and provided him with information on this.  Reginald Watkins reports his appetite has been poor but is improving since he began this class.   He has goals to increase his stamina and strength, and just "have more energy," and this is already happening.  Staff will continue to follow with Reginald Watkins throughout the course of this program.     Expected Outcomes Reginald Watkins will continue to exercise consistently and improve his mood; energy and appetite as a result.  Reginald Watkins will check with his Dr. or pharmacist about a natural OTC sleep aid to help with his current sleep concerns.  Staff will continue to follow.   Continue Psychosocial Services  Follow up required by staff      Psychosocial Re-Evaluation:     Psychosocial Re-Evaluation    [place] Name 11/17/16 0911             Psychosocial Re-Evaluation   Current issues with None Identified       Comments Reginald Watkins is doing well overall.  He is feeling better and stronger.  His biggest stressors currently is his golf game!  He did try the OTC  sleep aid, but it actually made him dizzy so he stopped.  After adjusting his late night snacking habits, he is now sleeping better.  He is feeling more positive overall.       Expected Outcomes ShortL Reginald Watkins will continue to attend rehab to work on strength and stamina.  Long: Continue to sleep better.       Interventions Encouraged to attend Cardiac Rehabilitation for the exercise          Psychosocial Discharge (Final Psychosocial Re-Evaluation):     Psychosocial Re-Evaluation - 11/17/16 0911      Psychosocial Re-Evaluation   Current issues with None Identified   Comments Reginald Watkins is doing well overall.  He is feeling better and stronger.  His biggest stressors currently is his golf game!  He did try the OTC sleep aid, but it actually made him dizzy so he stopped.  After adjusting his late night snacking habits, he is now sleeping better.  He is feeling more positive overall.   Expected Outcomes ShortL Reginald Watkins will continue to  attend rehab to work on strength and stamina.  Long: Continue to sleep better.   Interventions Encouraged to attend Cardiac Rehabilitation for the exercise      Vocational Rehabilitation: Provide vocational rehab assistance to qualifying candidates.   Vocational Rehab Evaluation & Intervention:     Vocational Rehab - 09/16/16 1537      Initial Vocational Rehab Evaluation & Intervention   Assessment shows need for Vocational Rehabilitation No      Education: Education Goals: Education classes will be provided on a weekly basis, covering required topics. Participant will state understanding/return demonstration of topics presented.  Learning Barriers/Preferences:     Learning Barriers/Preferences - 09/16/16 1536      Learning Barriers/Preferences   Learning Barriers None   Learning Preferences None      Education Topics: General Nutrition Guidelines/Fats and Fiber: -Group instruction provided by verbal, written material, models and posters to present the general guidelines for heart healthy nutrition. Gives an explanation and review of dietary fats and fiber.   Cardiac Rehab from 12/06/2016 in [facility] Cardiac and Pulmonary Rehab  Date  12/06/16  Educator  CR  Instruction Review Code  2- meets goals/outcomes      Controlling Sodium/Reading Food Labels: -Group verbal and written material supporting the discussion of sodium use in heart healthy nutrition. Review and explanation with models, verbal and written materials for utilization of the food label.   Cardiac Rehab from 12/06/2016 in [facility] Cardiac and Pulmonary Rehab  Date  10/18/16  Educator  CR  Instruction Review Code  2- meets goals/outcomes      Exercise Physiology & Risk Factors: - Group verbal and written instruction with models to review the exercise physiology of the cardiovascular system and associated critical values. Details cardiovascular disease risk factors and the goals associated with each risk factor.    Cardiac Rehab from 12/06/2016 in [facility] Cardiac and Pulmonary Rehab  Date  10/25/16  Educator  [facility]  Instruction Review Code  2- meets goals/outcomes      Aerobic Exercise & Resistance Training: - Gives group verbal and written discussion on the health impact of inactivity. On the components of aerobic and resistive training programs and the benefits of this training and how to safely progress through these programs.   Cardiac Rehab from 12/06/2016 in [facility] Cardiac and Pulmonary Rehab  Date  10/27/16  Educator  [facility]  Instruction Review Code  2- meets goals/outcomes  Flexibility, Balance, General Exercise Guidelines: - Provides group verbal and written instruction on the benefits of flexibility and balance training programs. Provides general exercise guidelines with specific guidelines to those with heart or lung disease. Demonstration and skill practice provided.   Cardiac Rehab from 12/06/2016 in [facility] Cardiac and Pulmonary Rehab  Date  11/01/16  Educator  [employer]  Instruction Review Code  2- meets goals/outcomes      Stress Management: - Provides group verbal and written instruction about the health risks of elevated stress, cause of high stress, and healthy ways to reduce stress.   Cardiac Rehab from 12/06/2016 in [facility] Cardiac and Pulmonary Rehab  Date  11/10/16  Educator  [facility]  Instruction Review Code  2- meets goals/outcomes      Depression: - Provides group verbal and written instruction on the correlation between heart/lung disease and depressed mood, treatment options, and the stigmas associated with seeking treatment.   Cardiac Rehab from 12/06/2016 in [facility] Cardiac and Pulmonary Rehab  Date  10/13/16  Educator  [facility]  Instruction Review Code  2- meets goals/outcomes      Anatomy & Physiology of the Heart: - Group verbal and written instruction and models provide basic cardiac anatomy and physiology, with the coronary electrical and arterial systems. Review of: AMI, Angina, Valve  disease, Heart Failure, Cardiac Arrhythmia, Pacemakers, and the ICD.   Cardiac Procedures: - Group verbal and written instruction and models to describe the testing methods done to diagnose heart disease. Reviews the outcomes of the test results. Describes the treatment choices: Medical Management, Angioplasty, or Coronary Bypass Surgery.   Cardiac Rehab from 12/06/2016 in [facility] Cardiac and Pulmonary Rehab  Date  11/15/16  Educator  CE  Instruction Review Code  2- meets goals/outcomes      Cardiac Medications: - Group verbal and written instruction to review commonly prescribed medications for heart disease. Reviews the medication, class of the drug, and side effects. Includes the steps to properly store meds and maintain the prescription regimen.   Cardiac Rehab from 12/06/2016 in [facility] Cardiac and Pulmonary Rehab  Date  11/24/16  Educator  SB  Instruction Review Code  1- partially meets, needs review/practice [part one]      Go Sex-Intimacy & Heart Disease, Get SMART - Goal Setting: - Group verbal and written instruction through game format to discuss heart disease and the return to sexual intimacy. Provides group verbal and written material to discuss and apply goal setting through the application of the S.M.A.R.T. Method.   Cardiac Rehab from 12/06/2016 in [facility] Cardiac and Pulmonary Rehab  Date  11/15/16  Educator  CE  Instruction Review Code  2- meets goals/outcomes      Other Matters of the Heart: - Provides group verbal, written materials and models to describe Heart Failure, Angina, Valve Disease, and Diabetes in the realm of heart disease. Includes description of the disease process and treatment options available to the cardiac patient.   Exercise & Equipment Safety: - Individual verbal instruction and demonstration of equipment use and safety with use of the equipment.   Cardiac Rehab from 12/06/2016 in [facility] Cardiac and Pulmonary Rehab  Date  09/16/16  Educator  PS   Instruction Review Code  2- meets goals/outcomes      Infection Prevention: - Provides verbal and written material to individual with discussion of infection control including proper hand washing and proper equipment cleaning during exercise session.   Cardiac Rehab from 12/06/2016 in [facility] Cardiac and Pulmonary Rehab  Date  09/16/16  Educator  PS  Instruction Review Code  2- meets goals/outcomes      Falls Prevention: - Provides verbal and written material to individual with discussion of falls prevention and safety.   Cardiac Rehab from 12/06/2016 in [facility] Cardiac and Pulmonary Rehab  Date  09/16/16  Educator  PS  Instruction Review Code  2- meets goals/outcomes      Diabetes: - Individual verbal and written instruction to review signs/symptoms of diabetes, desired ranges of glucose level fasting, after meals and with exercise. Advice that pre and post exercise glucose checks will be done for 3 sessions at entry of program.    Knowledge Questionnaire Score:     Knowledge Questionnaire Score - 11/26/16 1307      Knowledge Questionnaire Score   Pre Score 17/28   Post Score 26/28  test reviewed with pt today      Core Components/Risk Factors/Patient Goals at Admission:     Personal Goals and Risk Factors at Admission - 09/16/16 1518      Core Components/Risk Factors/Patient Goals on Admission    Weight Management Obesity;Weight Loss   Sedentary Yes   Intervention Provide advice, education, support and counseling about physical activity/exercise needs.;Develop an individualized exercise prescription for aerobic and resistive training based on initial evaluation findings, risk stratification, comorbidities and participant's personal goals.   Expected Outcomes Achievement of increased cardiorespiratory fitness and enhanced flexibility, muscular endurance and strength shown through measurements of functional capacity and personal statement of participant.   Increase Strength  and Stamina Yes   Intervention Provide advice, education, support and counseling about physical activity/exercise needs.;Develop an individualized exercise prescription for aerobic and resistive training based on initial evaluation findings, risk stratification, comorbidities and participant's personal goals.   Expected Outcomes Achievement of increased cardiorespiratory fitness and enhanced flexibility, muscular endurance and strength shown through measurements of functional capacity and personal statement of participant.   Heart Failure Yes   Intervention Provide a combined exercise and nutrition program that is supplemented with education, support and counseling about heart failure. Directed toward relieving symptoms such as shortness of breath, decreased exercise tolerance, and extremity edema.   Expected Outcomes Improve functional capacity of life;Short term: Attendance in program 2-3 days a week with increased exercise capacity. Reported lower sodium intake. Reported increased fruit and vegetable intake. Reports medication compliance.;Short term: Daily weights obtained and reported for increase. Utilizing diuretic protocols set by physician.;Long term: Adoption of self-care skills and reduction of barriers for early signs and symptoms recognition and intervention leading to self-care maintenance.   Lipids Yes   Intervention Provide education and support for participant on nutrition & aerobic/resistive exercise along with prescribed medications to achieve LDL <15m, HDL >492m   Expected Outcomes Short Term: Participant states understanding of desired cholesterol values and is compliant with medications prescribed. Participant is following exercise prescription and nutrition guidelines.;Long Term: Cholesterol controlled with medications as prescribed, with individualized exercise RX and with personalized nutrition plan. Value goals: LDL < 7030mHDL > 40 mg.   Stress Yes   Intervention Offer  individual and/or small group education and counseling on adjustment to heart disease, stress management and health-related lifestyle change. Teach and support self-help strategies.   Expected Outcomes Short Term: Participant demonstrates changes in health-related behavior, relaxation and other stress management skills, ability to obtain effective social support, and compliance with psychotropic medications if prescribed.;Long Term: Emotional wellbeing is indicated by absence of clinically significant psychosocial distress or social isolation.      Core  Components/Risk Factors/Patient Goals Review:      Goals and Risk Factor Review    Row Name 10/25/16 0915 11/17/16 0851           Core Components/Risk Factors/Patient Goals Review   Personal Goals Review Weight Management/Obesity;Heart Failure;Hypertension;Lipids Weight Management/Obesity;Heart Failure;Hypertension;Lipids      Review Reginald Watkins is off to a good start with rehab.  His blood pressures have been good and he has not had any problems with taking his medications.  His weight has been fairly steady as he weighs daily and watches his sodium intake.  He has not had any heart failure symptoms. Reginald Watkins continues to do well in rehab.  His weight is down to 165 lbs.   He continues to monitor for heart failure symptoms and has not had any of recent.  He did have an increased heart rate after a medication change, but otherwise has continued to do well.  Blood pressures have been good as well.      Expected Outcomes Short: Reginald Watkins will continue to monitor his heart failure symptoms daily.  Long: Continue to attend rehab to work on risk factor modification. Short: Reginald Watkins will continue to stay on top of his symptoms.  Long: Continue to work on weight loss.         Core Components/Risk Factors/Patient Goals at Discharge (Final Review):      Goals and Risk Factor Review - 11/17/16 0851      Core Components/Risk Factors/Patient Goals Review   Personal Goals  Review Weight Management/Obesity;Heart Failure;Hypertension;Lipids   Review Reginald Watkins continues to do well in rehab.  His weight is down to 165 lbs.   He continues to monitor for heart failure symptoms and has not had any of recent.  He did have an increased heart rate after a medication change, but otherwise has continued to do well.  Blood pressures have been good as well.   Expected Outcomes Short: Reginald Watkins will continue to stay on top of his symptoms.  Long: Continue to work on weight loss.      ITP Comments:     ITP Comments    Row Name 09/16/16 1506 10/06/16 0554 10/22/16 0851 11/03/16 0545 12/01/16 0541   ITP Comments Medical review completed, Initial ITP completed.  Diagnosis documentation is in [place] in Discharge Note from [facility] on 09/05/2016 30 day review. Continue with ITP unless directed changes per Medical Director review. New to program Reginald Watkins increased his treadmill incline today to 2.5%.  30 day review. Continue with ITP unless directed changes per Medical Director review 30 day review. Continue with ITP unless directed changes per Medical Director review      Comments: Discharge ITP

## 2016-12-06 NOTE — Progress Notes (Signed)
Daily Session Note  Patient Details  Name: Reginald Watkins MRN: [id number] Date of Birth: Nov 22, 1945 Referring Provider:     Cardiac Rehab from 10/11/2016 in [facility] Cardiac and Pulmonary Rehab  Referring Provider  Nehemiah Massed      Encounter Date: 12/06/2016  Check In:     Session Check In - 12/06/16 9357      Check-In   Location [facility]-Cardiac & Pulmonary Rehab   Staff Present Gerlene Burdock, RN, Moises Blood, BS, ACSM CEP, Exercise Physiologist;Jessica Luan Pulling, [place], ACSM RCEP, Exercise Physiologist   Supervising physician immediately available to respond to emergencies See telemetry face sheet for immediately available ER MD   Medication changes reported     No   Fall or balance concerns reported    No   Warm-up and Cool-down Performed on first and last piece of equipment   Resistance Training Performed Yes   VAD Patient? No     Pain Assessment   Currently in Pain? No/denies   Multiple Pain Sites No         History  Smoking Status  . Former Smoker  . Types: Pipe  Smokeless Tobacco  . Former Systems developer  . Quit date: 1998    Goals Met:  Independence with exercise equipment Exercise tolerated well No report of cardiac concerns or symptoms Strength training completed today  Goals Unmet:  Not Applicable  Comments:  Ociel graduated today from cardiac rehab with 36 sessions completed.  Details of the patient's exercise prescription and what He needs to do in order to continue the prescription and progress were discussed with patient.  Patient was given a copy of prescription and goals.  Patient verbalized understanding.  Jaivyn plans to continue to exercise by exercising at [employer].    Dr. Emily Filbert is Medical Director for [facility].

## 2017-06-20 ENCOUNTER — Ambulatory Visit: Payer: Medicare HMO | Admitting: Certified Registered Nurse Anesthetist

## 2017-06-20 ENCOUNTER — Encounter: Payer: Self-pay | Admitting: Certified Registered Nurse Anesthetist

## 2017-06-20 ENCOUNTER — Encounter
Admission: RE | Disposition: A | Discharge status: Home / Self Care | Payer: Self-pay | Source: Ambulatory Visit | Attending: Gastroenterology

## 2017-06-20 ENCOUNTER — Ambulatory Visit
Admission: RE | Admit: 2017-06-20 | Discharge: 2017-06-20 | Disposition: A | Discharge status: Home / Self Care | Payer: Medicare HMO | Source: Ambulatory Visit | Attending: Gastroenterology | Admitting: Gastroenterology

## 2017-06-20 DIAGNOSIS — K219 Gastro-esophageal reflux disease without esophagitis: Secondary | ICD-10-CM | POA: Diagnosis not present

## 2017-06-20 DIAGNOSIS — Z8673 Personal history of transient ischemic attack (TIA), and cerebral infarction without residual deficits: Secondary | ICD-10-CM | POA: Diagnosis not present

## 2017-06-20 DIAGNOSIS — M1990 Unspecified osteoarthritis, unspecified site: Secondary | ICD-10-CM | POA: Diagnosis not present

## 2017-06-20 DIAGNOSIS — Z1211 Encounter for screening for malignant neoplasm of colon: Secondary | ICD-10-CM | POA: Diagnosis present

## 2017-06-20 DIAGNOSIS — Z8549 Personal history of malignant neoplasm of other male genital organs: Secondary | ICD-10-CM | POA: Diagnosis not present

## 2017-06-20 DIAGNOSIS — Z952 Presence of prosthetic heart valve: Secondary | ICD-10-CM | POA: Insufficient documentation

## 2017-06-20 DIAGNOSIS — Z7982 Long term (current) use of aspirin: Secondary | ICD-10-CM | POA: Insufficient documentation

## 2017-06-20 DIAGNOSIS — K621 Rectal polyp: Secondary | ICD-10-CM | POA: Diagnosis not present

## 2017-06-20 DIAGNOSIS — K635 Polyp of colon: Secondary | ICD-10-CM | POA: Diagnosis not present

## 2017-06-20 DIAGNOSIS — I739 Peripheral vascular disease, unspecified: Secondary | ICD-10-CM | POA: Insufficient documentation

## 2017-06-20 DIAGNOSIS — D122 Benign neoplasm of ascending colon: Secondary | ICD-10-CM | POA: Diagnosis not present

## 2017-06-20 DIAGNOSIS — D124 Benign neoplasm of descending colon: Secondary | ICD-10-CM | POA: Insufficient documentation

## 2017-06-20 DIAGNOSIS — Z79899 Other long term (current) drug therapy: Secondary | ICD-10-CM | POA: Diagnosis not present

## 2017-06-20 DIAGNOSIS — D120 Benign neoplasm of cecum: Secondary | ICD-10-CM | POA: Diagnosis not present

## 2017-06-20 DIAGNOSIS — K5730 Diverticulosis of large intestine without perforation or abscess without bleeding: Secondary | ICD-10-CM | POA: Diagnosis not present

## 2017-06-20 DIAGNOSIS — D125 Benign neoplasm of sigmoid colon: Secondary | ICD-10-CM | POA: Insufficient documentation

## 2017-06-20 DIAGNOSIS — Z86010 Personal history of colonic polyps: Secondary | ICD-10-CM | POA: Insufficient documentation

## 2017-06-20 HISTORY — PX: COLONOSCOPY WITH PROPOFOL: SHX5780

## 2017-06-20 HISTORY — DX: Nonrheumatic aortic (valve) stenosis: I35.0

## 2017-06-20 HISTORY — DX: Unspecified osteoarthritis, unspecified site: M19.90

## 2017-06-20 HISTORY — DX: Occlusion and stenosis of unspecified carotid artery: I65.29

## 2017-06-20 HISTORY — DX: Cerebral infarction, unspecified: I63.9

## 2017-06-20 HISTORY — DX: Peripheral vascular disease, unspecified: I73.9

## 2017-06-20 HISTORY — DX: Other specified postprocedural states: Z98.890

## 2017-06-20 HISTORY — DX: Polyp of colon: K63.5

## 2017-06-20 HISTORY — DX: Gastro-esophageal reflux disease without esophagitis: K21.9

## 2017-06-20 HISTORY — DX: Dyspnea, unspecified: R06.00

## 2017-06-20 HISTORY — DX: Malignant neoplasm of penis, unspecified: C60.9

## 2017-06-20 SURGERY — COLONOSCOPY WITH PROPOFOL
Anesthesia: General

## 2017-06-20 MED ORDER — AMPICILLIN SODIUM 2 G IJ SOLR
2.0000 g | Freq: Once | INTRAMUSCULAR | Status: AC
  Administered 2017-06-20: 2 g via INTRAVENOUS
  Filled 2017-06-20: qty 2000

## 2017-06-20 MED ORDER — PROPOFOL 10 MG/ML IV BOLUS
INTRAVENOUS | Status: AC
  Filled 2017-06-20: qty 40

## 2017-06-20 MED ORDER — PHENYLEPHRINE HCL 10 MG/ML IJ SOLN
INTRAMUSCULAR | Status: DC | PRN
  Administered 2017-06-20 (×2): 100 ug via INTRAVENOUS

## 2017-06-20 MED ORDER — SODIUM CHLORIDE 0.9 % IV SOLN
INTRAVENOUS | Status: DC
  Administered 2017-06-20: 10:00:00 via INTRAVENOUS

## 2017-06-20 MED ORDER — PROPOFOL 10 MG/ML IV BOLUS
INTRAVENOUS | Status: AC
  Filled 2017-06-20: qty 20

## 2017-06-20 MED ORDER — SODIUM CHLORIDE 0.9 % IV SOLN
INTRAVENOUS | Status: AC
  Filled 2017-06-20: qty 2000

## 2017-06-20 MED ORDER — LIDOCAINE HCL (CARDIAC) 20 MG/ML IV SOLN
INTRAVENOUS | Status: DC | PRN
  Administered 2017-06-20: 40 mg via INTRAVENOUS

## 2017-06-20 MED ORDER — SODIUM CHLORIDE 0.9 % IV SOLN: INTRAVENOUS | Status: DC

## 2017-06-20 MED ORDER — LIDOCAINE HCL (PF) 2 % IJ SOLN
INTRAMUSCULAR | Status: AC
  Filled 2017-06-20: qty 10

## 2017-06-20 MED ORDER — PROPOFOL 10 MG/ML IV BOLUS
INTRAVENOUS | Status: DC | PRN
  Administered 2017-06-20: 1580 mg via INTRAVENOUS

## 2017-06-20 NOTE — Anesthesia Preprocedure Evaluation (Signed)
Anesthesia Evaluation  Patient identified by MRN, date of birth, ID band Patient awake    Reviewed: Allergy & Precautions, NPO status , Patient's Chart, lab work & pertinent test results  History of Anesthesia Complications Negative for: history of anesthetic complications  Airway Mallampati: II  TM Distance: >3 FB Neck ROM: Full    Dental no notable dental hx.    Pulmonary neg sleep apnea, neg COPD, former smoker,    breath sounds clear to auscultation- rhonchi (-) wheezing      Cardiovascular hypertension, (-) Past MI and (-) Cardiac Stents + Valvular Problems/Murmurs (hx of severe AS s/p AVR)  Rhythm:Regular Rate:Normal - Systolic murmurs and - Diastolic murmurs    Neuro/Psych TIAnegative psych ROS   GI/Hepatic Neg liver ROS, GERD  ,  Endo/Other  negative endocrine ROSneg diabetes  Renal/GU negative Renal ROS     Musculoskeletal  (+) Arthritis ,   Abdominal (+) - obese,   Peds  Hematology negative hematology ROS (+)   Anesthesia Other Findings Past Medical History: No date: Aortic stenosis No date: Cancer [facility])     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  No date: Carotid atherosclerosis No date: Colon polyps No date: Dyspnea No date: GERD (gastroesophageal reflux disease) No date: Osteoarthritis No date: Penile cancer (HCC) No date: PVD (peripheral vascular disease) ([place]) No date: Stroke ([place])     Comment:  tia No date: TIA (transient ischemic attack)   Reproductive/Obstetrics                             Anesthesia Physical Anesthesia Plan  ASA: III  Anesthesia Plan: General   Post-op Pain Management:    Induction: Intravenous  PONV Risk Score and Plan: 2 and Propofol infusion  Airway Management Planned: Natural Airway  Additional Equipment:   Intra-op Plan:   Post-operative Plan:   Informed Consent: I have reviewed the patients History and Physical, chart,  labs and discussed the procedure including the risks, benefits and alternatives for the proposed anesthesia with the patient or authorized representative who has indicated his/her understanding and acceptance.   Dental advisory given  Plan Discussed with: CRNA and Anesthesiologist  Anesthesia Plan Comments:         Anesthesia Quick Evaluation

## 2017-06-20 NOTE — H&P (Signed)
Outpatient short stay form Pre-procedure 06/20/2017 10:19 AM Lollie Sails MD  Primary Physician: Dr. Fulton Reek  Reason for visit:  Colonoscopy  History of present illness:  Patient is a 71 year old male presenting today as above. Tolerated his prep well. He does take 81 mg aspirin daily. That has been held for several days. Patient has a history of a hoarse seen valve replacement of the aortic valve about 10 months ago. He has been recommended to have antibiotics for dental work. We will give him some prophylactic antibiotic today.    Current Facility-Administered Medications:  .  0.9 %  sodium chloride infusion, , Intravenous, Continuous, Lollie Sails, MD, Last Rate: 20 mL/hr at 06/20/17 1013 .  0.9 %  sodium chloride infusion, , Intravenous, Continuous, Lollie Sails, MD .  sodium chloride 0.9 % with ampicillin (OMNIPEN) ADS Med, , , ,   Medications Prior to Admission  Medication Sig Dispense Refill Last Dose  . metoprolol succinate (TOPROL-XL) 50 MG 24 hr tablet Take 50 mg daily by mouth. Take with or immediately following a meal.   06/20/2017 at Unknown time  . pantoprazole (PROTONIX) 40 MG tablet Take 40 mg by mouth daily.   06/19/2017 at Unknown time  . aspirin (GOODSENSE ASPIRIN) 325 MG tablet Take 325 mg by mouth daily.   Not Taking at Unknown time  . aspirin 81 MG chewable tablet Chew 81 mg by mouth daily.   06/13/2017  . atorvastatin (LIPITOR) 40 MG tablet Take 40 mg by mouth daily.   06/15/2017  . azelastine (ASTELIN) 0.1 % nasal spray Place 2 (two) times daily into both nostrils. Use in each nostril as directed   Not Taking at Unknown time  . furosemide (LASIX) 20 MG tablet Take 20 mg by mouth 2 (two) times daily.   Not Taking  . Glucosamine-Chondroitin 250-200 MG TABS Take 1 tablet by mouth daily.   06/14/2017  . metoprolol tartrate (LOPRESSOR) 25 MG tablet Take 25 mg by mouth every 12 (twelve) hours.   Taking  . Misc Natural Products (OSTEO BI-FLEX JOINT  SHIELD) TABS Take 1 tablet by mouth daily.   06/13/2017  . Multiple Vitamin (MULTIVITAMIN) capsule Take 1 capsule by mouth daily.   06/13/2017  . potassium chloride SA (K-DUR,KLOR-CON) 20 MEQ tablet Take 20 mEq by mouth every 12 (twelve) hours.   Not Taking  . traZODone (DESYREL) 50 MG tablet Take 50 mg at bedtime by mouth.   Not Taking at Unknown time     No Known Allergies   Past Medical History:  Diagnosis Date  . Aortic stenosis   . 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 Carotid atherosclerosis   . Colon polyps   . Dyspnea   . GERD (gastroesophageal reflux disease)   . Osteoarthritis   . Penile cancer ([place])   . PVD (peripheral vascular disease) ([place])   . Stroke ([place])    tia  . TIA (transient ischemic attack)     Review of systems:      Physical Exam    Heart and lungs: Regular rate and rhythm without rub or gallop, lungs are bilaterally clear.    HEENT: Normocephalic atraumatic eyes are anicteric    Other:     Pertinant exam for procedure: Soft nontender nondistended bowel sounds positive normoactive.    Planned proceedures: Colonoscopy and indicated procedures. I have discussed the risks benefits and complications of procedures to include not limited to bleeding, infection,  perforation and the risk of sedation and the patient wishes to proceed.    Lollie Sails, MD Gastroenterology 06/20/2017  10:19 AM

## 2017-06-20 NOTE — Anesthesia Post-op Follow-up Note (Signed)
Anesthesia QCDR form completed.        

## 2017-06-20 NOTE — Anesthesia Postprocedure Evaluation (Signed)
Anesthesia Post Note  Patient: Reginald Watkins  Procedure(s) Performed: COLONOSCOPY WITH PROPOFOL (N/A )  Patient location during evaluation: Endoscopy Anesthesia Type: General Level of consciousness: awake and alert and oriented Pain management: pain level controlled Vital Signs Assessment: post-procedure vital signs reviewed and stable Respiratory status: spontaneous breathing, nonlabored ventilation and respiratory function stable Cardiovascular status: blood pressure returned to baseline and stable Postop Assessment: no signs of nausea or vomiting Anesthetic complications: no     Last Vitals:  Vitals:   06/20/17 1341 06/20/17 1351  BP: 100/66 127/68  Pulse: (!) 55 (!) 51  Resp: 10 15  Temp:    SpO2: 100% 100%    Last Pain:  Vitals:   06/20/17 1331  TempSrc:   PainSc: Asleep                 Tanikka Bresnan

## 2017-06-20 NOTE — Op Note (Signed)
[facility] Patient Name: Reginald Watkins Procedure Date: 06/20/2017 10:26 AM MRN: [id number] Account #: [id number] Date of Birth: [birth year] Admit Type: Outpatient Age: 71 Room: [facility] ENDO ROOM 3 Gender: Male Note Status: Finalized Procedure:            Colonoscopy Indications:          Personal history of colonic polyps Providers:            Lollie Sails, MD Referring MD:         Leonie Douglas. Doy Hutching, MD (Referring MD) Medicines:            Monitored Anesthesia Care Complications:        No immediate complications. Procedure:            Pre-Anesthesia Assessment:                       - ASA Grade Assessment: III - A patient with severe                        systemic disease.                       After obtaining informed consent, the colonoscope was                        passed under direct vision. Throughout the procedure,                        the patient's blood pressure, pulse, and oxygen                        saturations were monitored continuously. The                        Colonoscope was introduced through the anus and                        advanced to the the cecum, identified by appendiceal                        orifice and ileocecal valve. The colonoscopy was                        unusually difficult due to restricted mobility of the                        colon, significant looping and a tortuous colon.                        Successful completion of the procedure was aided by                        changing the patient to a supine position and using                        manual pressure. The patient tolerated the procedure                        well. The quality of the bowel preparation was fair. Findings:      Two sessile polyps were found  in the sigmoid colon. The polyps were 1 to       2 mm in size. These polyps were removed with a cold biopsy forceps.       Resection and retrieval were complete.      Three sessile  polyps were found in the cecum. The polyps were 3 to 14 mm       in size. These polyps were removed with a cold snare and cold forcep.       Resection and retrieval were complete.      A 30x10 mm polyp was found in the proximal ascending colon. The polyp       was sessile. Polypectomy was attempted, initially using a cold snare.       Polyp resection was incomplete with this device. This intervention then       required a different device and polypectomy technique. The polyp was       removed with a cold biopsy forceps. Resection and retrieval were       complete. To prevent bleeding after the polypectomy, two hemostatic       clips were successfully placed (MR conditional). There was no bleeding       at the end of the maneuver.      The clip placements were difficult due to the tortuosity of the right       colon, and I was only able to get 2 clips through the scope.      Two sessile polyps were found in the descending colon. The polyps were 9       to 12 mm in size. These polyps were removed with a cold snare and cold       forcep. Resection and retrieval were complete.      A 7 mm polyp was found in the proximal sigmoid colon. The polyp was       sessile. The polyp was removed with a cold snare. Resection and       retrieval were complete.      A 5 mm polyp was found in the rectum. The polyp was sessile. The polyp       was removed with a cold snare. Resection and retrieval were complete.      Multiple small-mouthed diverticula were found in the sigmoid colon and       distal descending colon.      The retroflexed view of the distal rectum and anal verge was normal and       showed no anal or rectal abnormalities otherwise. Impression:           - Preparation of the colon was fair.                       - Two 1 to 2 mm polyps in the sigmoid colon, removed                        with a cold biopsy forceps. Resected and retrieved.                       - Three 3 to 14 mm polyps in the  cecum, removed with a                        cold snare. Resected and retrieved.                       -  One 30 mm polyp in the proximal ascending colon,                        removed with a cold biopsy forceps. Resected and                        retrieved. Clips (MR conditional) were placed.                       - Two 9 to 12 mm polyps in the descending colon,                        removed with a cold snare. Resected and retrieved.                       - One 7 mm polyp in the proximal sigmoid colon, removed                        with a cold snare. Resected and retrieved.                       - One 5 mm polyp in the rectum, removed with a cold                        snare. Resected and retrieved.                       - Diverticulosis in the sigmoid colon and in the distal                        descending colon.                       - The distal rectum and anal verge are normal on                        retroflexion view. Recommendation:       - Discharge patient to home.                       - Telephone GI clinic for pathology results in 1 week. Procedure Code(s):    --- Professional ---                       [phone], Colonoscopy, flexible; with removal of tumor(s),                        polyp(s), or other lesion(s) by snare technique                       [id number], 22, Colonoscopy, flexible; with biopsy, single                        or multiple Diagnosis Code(s):    --- Professional ---                       D12.5, Benign neoplasm of sigmoid colon                       D12.0, Benign neoplasm of cecum  D12.4, Benign neoplasm of descending colon                       D12.2, Benign neoplasm of ascending colon                       K62.1, Rectal polyp                       Z86.010, Personal history of colonic polyps                       K57.30, Diverticulosis of large intestine without                        perforation or abscess without bleeding CPT  copyright 2016 American Medical Association. All rights reserved. The codes documented in this report are preliminary and upon coder review may  be revised to meet current compliance requirements. Lollie Sails, MD 06/20/2017 1:16:17 PM This report has been signed electronically. Number of Addenda: 0 Note Initiated On: 06/20/2017 10:26 AM Scope Withdrawal Time: 1 hour 13 minutes 6 seconds  Total Procedure Duration: 2 hours 35 minutes 2 seconds       [facility]

## 2017-06-20 NOTE — Transfer of Care (Signed)
Immediate Anesthesia Transfer of Care Note  Patient: Reginald Watkins  Procedure(s) Performed: COLONOSCOPY WITH PROPOFOL (N/A )  Patient Location: PACU  Anesthesia Type:MAC  Level of Consciousness: sedated  Airway & Oxygen Therapy: Patient Spontanous Breathing and Patient connected to nasal cannula oxygen  Post-op Assessment: Report given to RN and Post -op Vital signs reviewed and stable  Post vital signs: Reviewed and stable  Last Vitals:  Vitals:   06/20/17 1014 06/20/17 1311  BP: 137/78 98/60  Pulse: 93 (!) 56  Resp: 20 11  Temp: (!) 36.2 C (!) 36 C  SpO2: 100% 99%    Last Pain:  Vitals:   06/20/17 1311  TempSrc: Tympanic         Complications: No apparent anesthesia complications

## 2017-06-21 ENCOUNTER — Encounter: Payer: Self-pay | Admitting: Gastroenterology

## 2017-06-21 LAB — SURGICAL PATHOLOGY

## 2017-09-25 IMAGING — CT CT HEAD W/O CM
3 series · 16 of 47 positions shown, 19 images · non-contrast
Comparison: None.

CLINICAL DATA: TIA.

EXAM:
CT HEAD WITHOUT CONTRAST
TECHNIQUE: Contiguous axial images were obtained from the base of the skull
through the vertex without intravenous contrast.

[Series 2: head wo · axial · 0.43mm/px · z∈[-71,+54]mm · 10 of 30 slices shown, 13 images]
[im 3/30  brain]
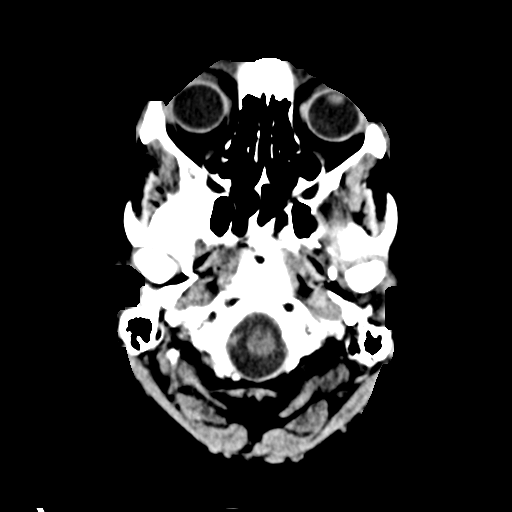
[im 3/30  bone]
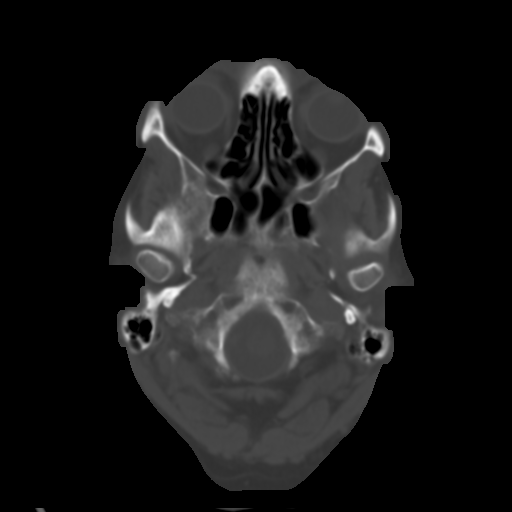
[im 6/30  brain]
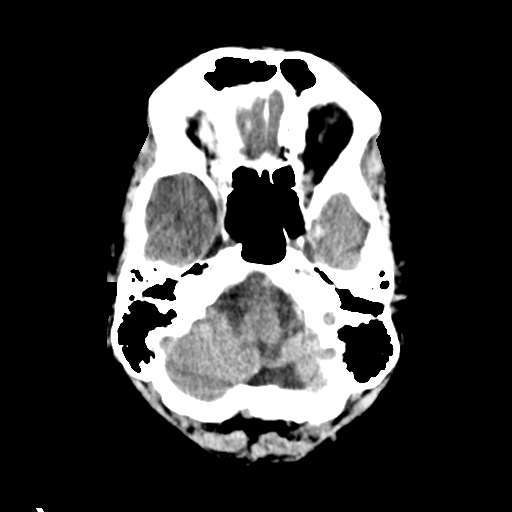
[im 9/30  brain]
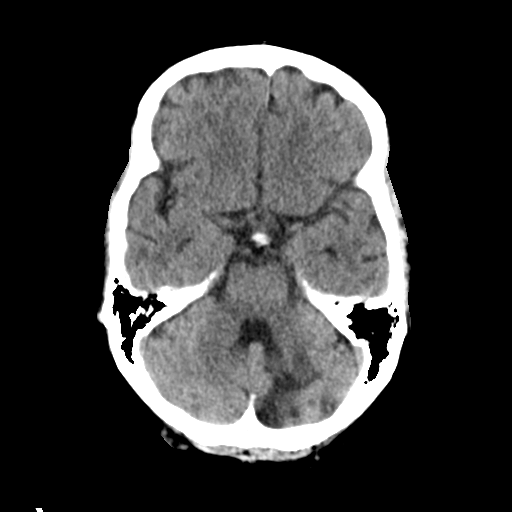
[im 11/30  brain]
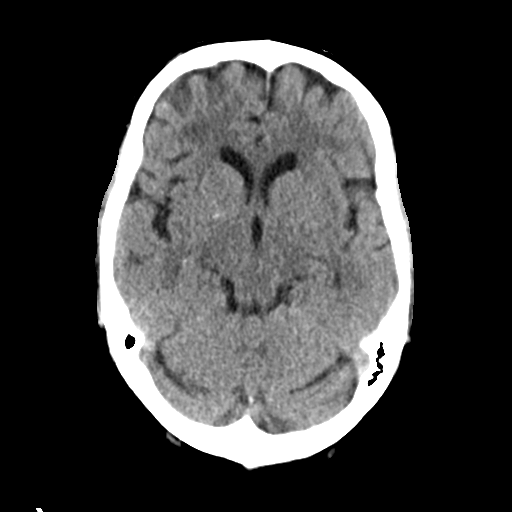
[im 14/30  brain]
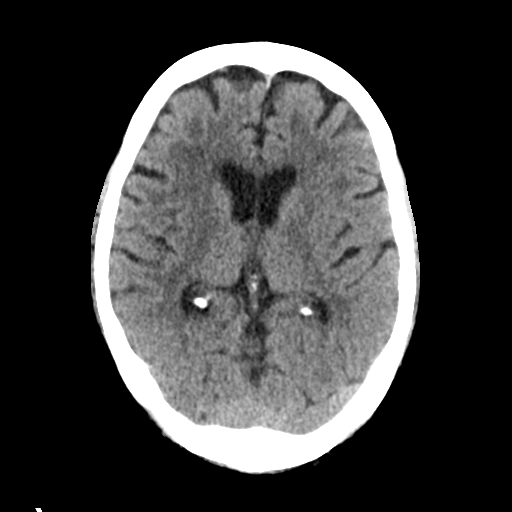
[im 14/30  bone]
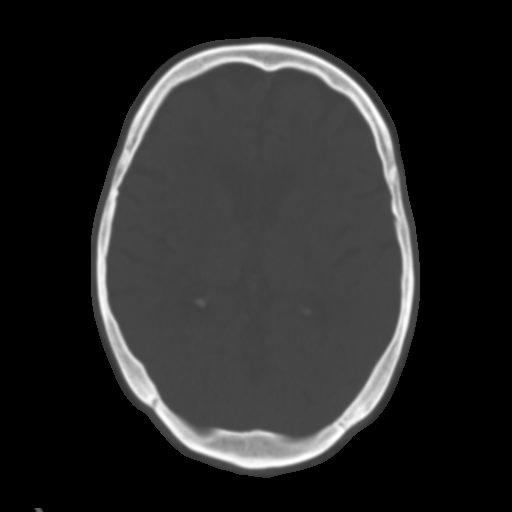
[im 17/30  brain]
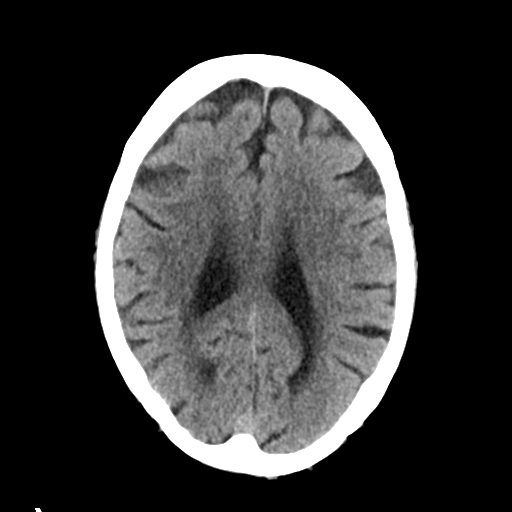
[im 20/30  brain]
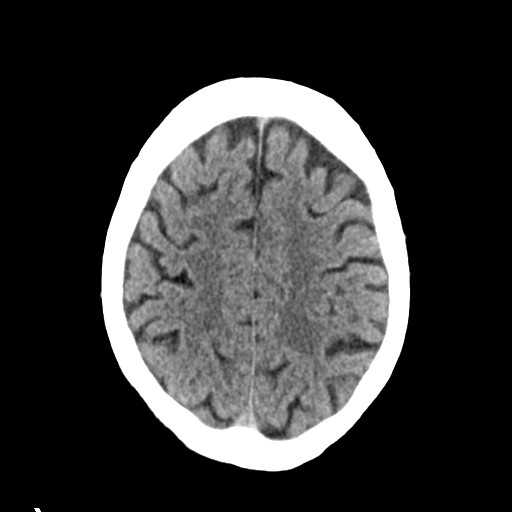
[im 23/30  brain]
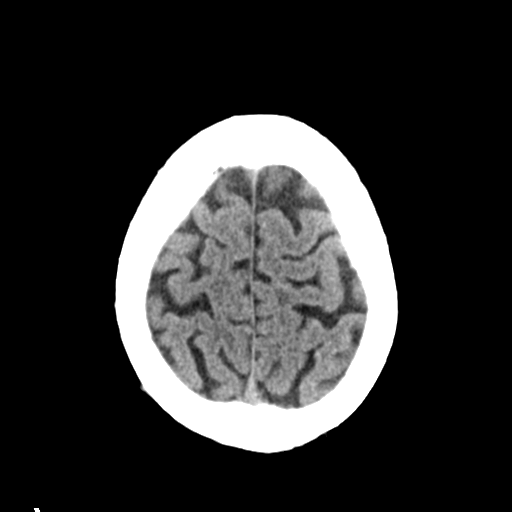
[im 25/30  brain]
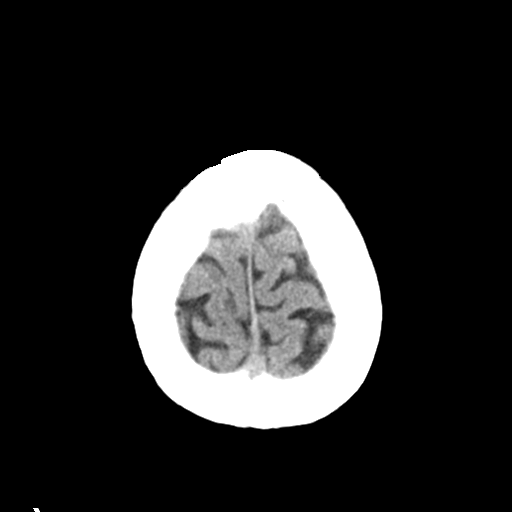
[im 25/30  bone]
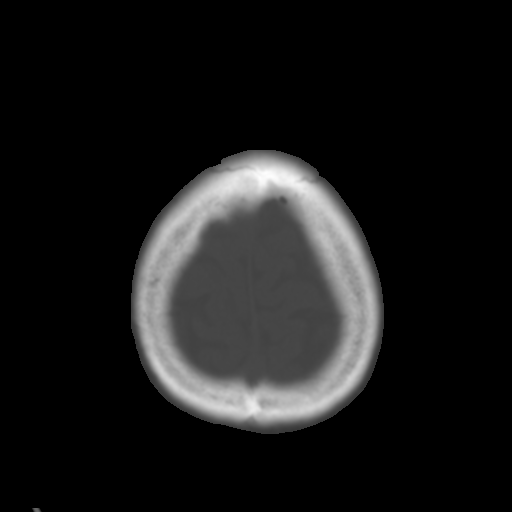
[im 28/30  brain]
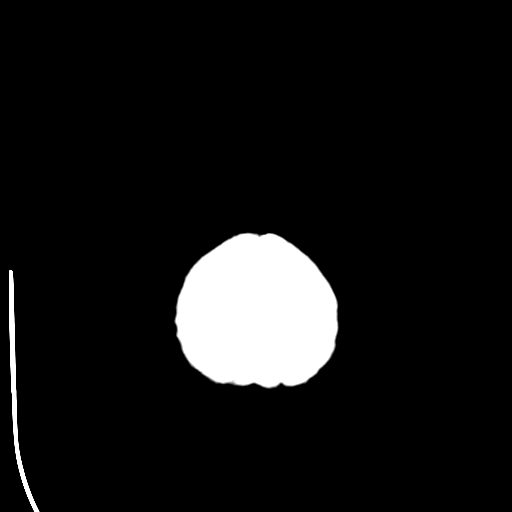

[Series 4: coronal soft tissue · coronal · 0.31mm/px · 3 of 62 slices shown]
[im 21/62  brain]
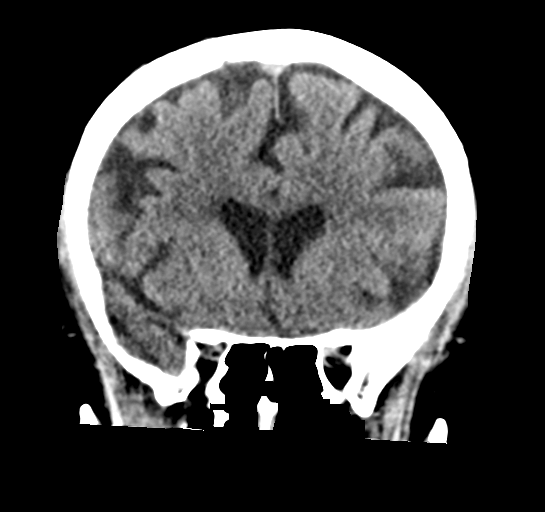
[im 28/62  brain]
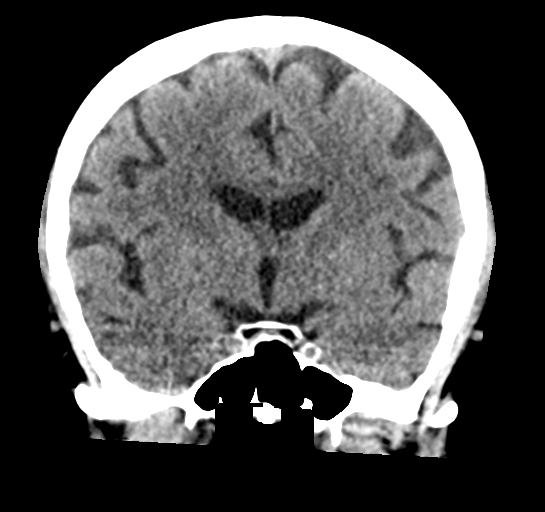
[im 34/62  brain]
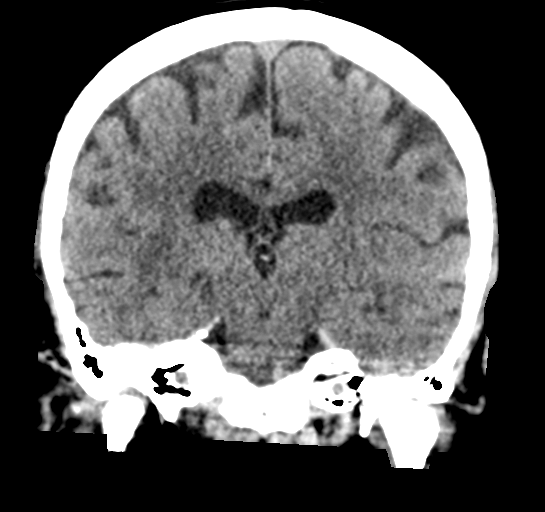

[Series 5: sagittal soft tissue · sagittal · 0.31mm/px · 3 of 50 slices shown]
[im 17/50  brain]
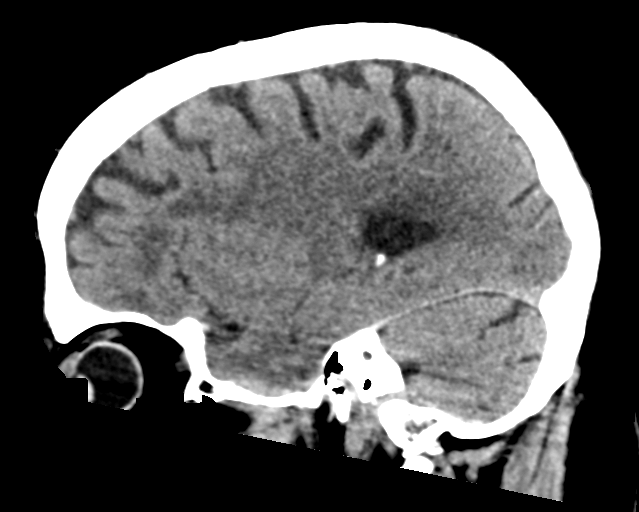
[im 25/50  brain]
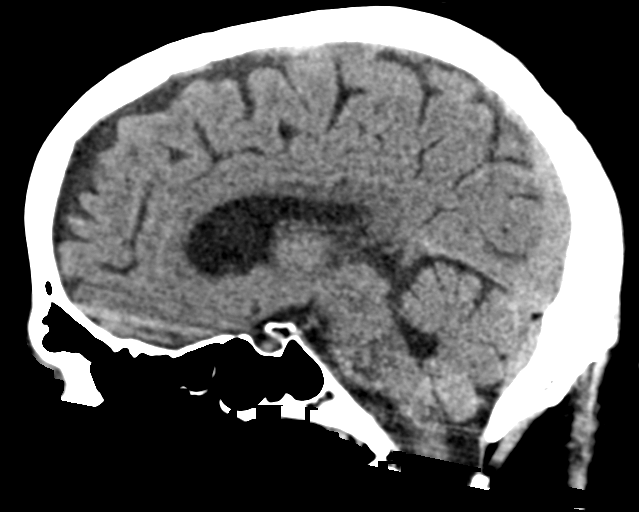
[im 33/50  brain]
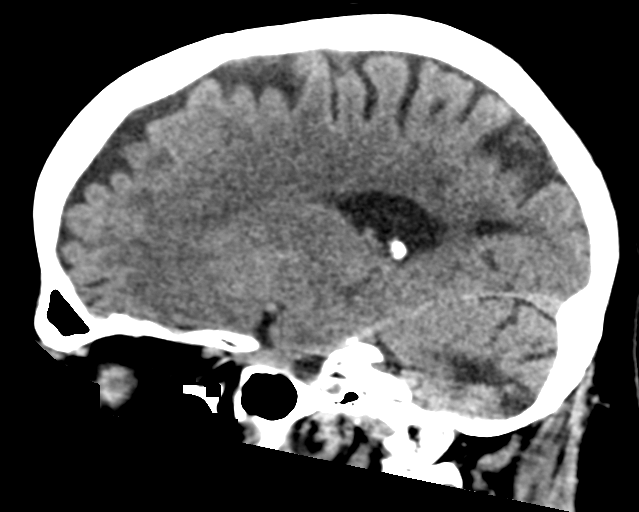

[16 of 47 positions shown; findings below may reference images not displayed]

FINDINGS: Brain: Cerebral volume normal. Ventricle size normal for the level
of atrophy.

Chronic infarct left inferior cerebellum. Patchy hypodensity
cerebral white matter bilaterally most consistent with chronic
microvascular ischemia.

Negative for acute infarct. Negative for hemorrhage or mass. No
shift of the midline structures.

Vascular: No hyperdense vessel or unexpected calcification.

Skull: Negative

Sinuses/Orbits: Negative

Other: None
IMPRESSION: Chronic ischemic changes.  No acute abnormality.
